# Patient Record
Sex: Male | Born: 1990 | Race: White | Hispanic: No | Marital: Married | Smoking: Former smoker
Health system: Southern US, Community
[De-identification: ages and names within clinical notes are randomized; demographics above are authoritative.]

---
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
## 2019-01-13 ENCOUNTER — Other Ambulatory Visit: Payer: Self-pay | Admitting: Adult Health

## 2019-01-13 DIAGNOSIS — F909 Attention-deficit hyperactivity disorder, unspecified type: Secondary | ICD-10-CM

## 2019-01-13 MED ORDER — AMPHETAMINE-DEXTROAMPHETAMINE 20 MG PO TABS: 20.0000 mg | ORAL_TABLET | Freq: Every day | ORAL | 0 refills | Status: DC

## 2019-02-10 ENCOUNTER — Ambulatory Visit (INDEPENDENT_AMBULATORY_CARE_PROVIDER_SITE_OTHER): Payer: Self-pay | Admitting: Adult Health

## 2019-02-10 ENCOUNTER — Encounter: Payer: Self-pay | Admitting: Adult Health

## 2019-02-10 ENCOUNTER — Other Ambulatory Visit: Payer: Self-pay

## 2019-02-10 DIAGNOSIS — F909 Attention-deficit hyperactivity disorder, unspecified type: Secondary | ICD-10-CM

## 2019-02-10 DIAGNOSIS — G4700 Insomnia, unspecified: Secondary | ICD-10-CM

## 2019-02-10 DIAGNOSIS — F411 Generalized anxiety disorder: Secondary | ICD-10-CM

## 2019-02-10 DIAGNOSIS — F331 Major depressive disorder, recurrent, moderate: Secondary | ICD-10-CM

## 2019-02-10 MED ORDER — BUSPIRONE HCL 10 MG PO TABS: 10.0000 mg | ORAL_TABLET | Freq: Three times a day (TID) | ORAL | 5 refills | Status: DC

## 2019-02-10 MED ORDER — AMPHETAMINE-DEXTROAMPHET ER 30 MG PO CP24: 30.0000 mg | ORAL_CAPSULE | Freq: Every day | ORAL | 0 refills | Status: DC

## 2019-02-10 MED ORDER — ESCITALOPRAM OXALATE 20 MG PO TABS: 20.0000 mg | ORAL_TABLET | Freq: Every day | ORAL | 5 refills | Status: DC

## 2019-02-10 NOTE — Progress Notes (Signed)
Devin Walker [id number] 07/29/1990 28 y.o.  Subjective:   Patient ID:  Devin Walker is a 28 y.o. (DOB 30-Sep-1990) male.  Chief Complaint:  Chief Complaint  Patient presents with  . ADHD  . Anxiety  . Depression    HPI Devin Walker presents to the office today for follow-up of anxiety, depression, and ADHD.  Describes mood today as ok. Pleasant. Mood symptoms - denies depression, anxiety, and irritability. Stating "I've been doing pretty good". Working full-time and having to study 3 hours a fay. Got a Oncologist with [employer] - 6 month program. If does well will have a job next April. Stable interest and motivation. Taking medications as prescribed.  Energy levels stable. Active, does not have a regular exercise routine. Works full-time as Radiographer, therapeutic. Increased workload with partner out of work for past few months.  Enjoys some usual interests and activities. Spending time with family - wife and 4 children. Mother local. Appetite adequate. Weight stable. Sleeps well most nights. Averages 8 hours. Focus and concentration stable. Concerned about Adderall IR formula not lasting long enough with increased study time - wanting to try the Xr formula. Completing tasks. Managing aspects of household. Work and school going well.  Denies SI or HI. Denies AH or VH.  Review of Systems:  Review of Systems  Musculoskeletal: Negative for gait problem.  Neurological: Negative for tremors.  Psychiatric/Behavioral:       Please refer to HPI    Medications: I have reviewed the patient's current medications.  Current Outpatient Medications  Medication Sig Dispense Refill  . amphetamine-dextroamphetamine (ADDERALL XR) 30 MG 24 hr capsule Take 1 capsule (30 mg total) by mouth daily. 30 capsule 0  . [START ON 03/10/2019] amphetamine-dextroamphetamine (ADDERALL XR) 30 MG 24 hr capsule Take 1 capsule (30 mg total) by mouth daily. 30 capsule 0  . [START ON 04/07/2019] amphetamine-dextroamphetamine  (ADDERALL XR) 30 MG 24 hr capsule Take 1 capsule (30 mg total) by mouth daily. 30 capsule 0  . busPIRone (BUSPAR) 10 MG tablet Take 1 tablet (10 mg total) by mouth 3 (three) times daily. 90 tablet 5  . escitalopram (LEXAPRO) 20 MG tablet Take 1 tablet (20 mg total) by mouth daily. 30 tablet 5   No current facility-administered medications for this visit.     Medication Side Effects: None  Allergies: No Known Allergies  History reviewed. No pertinent past medical history.  History reviewed. No pertinent family history.  Social History   Socioeconomic History  . Marital status: Single    Spouse name: Not on file  . Number of children: Not on file  . Years of education: Not on file  . Highest education level: Not on file  Occupational History  . Not on file  Social Needs  . Financial resource strain: Not on file  . Food insecurity    Worry: Not on file    Inability: Not on file  . Transportation needs    Medical: Not on file    Non-medical: Not on file  Tobacco Use  . Smoking status: Unknown If Ever Smoked  . Smokeless tobacco: Never Used  Substance and Sexual Activity  . Alcohol use: Not on file  . Drug use: Not on file  . Sexual activity: Not on file  Lifestyle  . Physical activity    Days per week: Not on file    Minutes per session: Not on file  . Stress: Not on file  Relationships  . Social connections  Talks on phone: Not on file    Gets together: Not on file    Attends religious service: Not on file    Active member of club or organization: Not on file    Attends meetings of clubs or organizations: Not on file    Relationship status: Not on file  . Intimate partner violence    Fear of current or ex partner: Not on file    Emotionally abused: Not on file    Physically abused: Not on file    Forced sexual activity: Not on file  Other Topics Concern  . Not on file  Social History Narrative  . Not on file    Past Medical History, Surgical history,  Social history, and Family history were reviewed and updated as appropriate.   Please see review of systems for further details on the patient's review from today.   Objective:   Physical Exam:  There were no vitals taken for this visit.  Physical Exam Constitutional:      General: He is not in acute distress.    Appearance: He is well-developed.  Musculoskeletal:        General: No deformity.  Neurological:     Mental Status: He is alert and oriented to person, place, and time.     Coordination: Coordination normal.  Psychiatric:        Attention and Perception: Attention and perception normal. He does not perceive auditory or visual hallucinations.        Mood and Affect: Mood normal. Mood is not anxious or depressed. Affect is not labile, blunt, angry or inappropriate.        Speech: Speech normal.        Behavior: Behavior normal.        Thought Content: Thought content normal. Thought content is not paranoid or delusional. Thought content does not include homicidal or suicidal ideation. Thought content does not include homicidal or suicidal plan.        Cognition and Memory: Cognition and memory normal.        Judgment: Judgment normal.     Comments: Insight intact     Lab Review:  No results found for: NA, K, CL, CO2, GLUCOSE, BUN, CREATININE, CALCIUM, PROT, ALBUMIN, AST, ALT, ALKPHOS, BILITOT, GFRNONAA, GFRAA  No results found for: WBC, RBC, HGB, HCT, PLT, MCV, MCH, MCHC, RDW, LYMPHSABS, MONOABS, EOSABS, BASOSABS  No results found for: POCLITH, LITHIUM   No results found for: PHENYTOIN, PHENOBARB, VALPROATE, CBMZ   .res Assessment: Plan:    Plan:  1. Lexapro 20mg  daily 2. Adderall XR 30mg  daily 3. Buspar 10mg  TID  RTC 3 months  Patient advised to contact office with any questions, adverse effects, or acute worsening in signs and symptoms.  Discussed potential benefits, risks, and side effects of stimulants with patient to include increased heart rate,  palpitations, insomnia, increased anxiety, increased irritability, or decreased appetite.  Instructed patient to contact office if experiencing any significant tolerability issues.  Devin Walker was seen today for adhd, anxiety and depression.  Diagnoses and all orders for this visit:  Generalized anxiety disorder -     escitalopram (LEXAPRO) 20 MG tablet; Take 1 tablet (20 mg total) by mouth daily. -     busPIRone (BUSPAR) 10 MG tablet; Take 1 tablet (10 mg total) by mouth 3 (three) times daily.  Major depressive disorder, recurrent episode, moderate (HCC) -     escitalopram (LEXAPRO) 20 MG tablet; Take 1 tablet (20 mg total) by mouth  daily.  Attention deficit hyperactivity disorder (ADHD), unspecified ADHD type -     amphetamine-dextroamphetamine (ADDERALL XR) 30 MG 24 hr capsule; Take 1 capsule (30 mg total) by mouth daily. -     amphetamine-dextroamphetamine (ADDERALL XR) 30 MG 24 hr capsule; Take 1 capsule (30 mg total) by mouth daily. -     amphetamine-dextroamphetamine (ADDERALL XR) 30 MG 24 hr capsule; Take 1 capsule (30 mg total) by mouth daily.  Insomnia, unspecified type     Please see After Visit Summary for patient specific instructions.  Future Appointments  Date Time Provider Department Center  05/13/2019  8:00 AM Ori Trejos, Thereasa Solo, NP [facility] None    No orders of the defined types were placed in this encounter.   -------------------------------

## 2019-03-16 ENCOUNTER — Other Ambulatory Visit: Payer: Self-pay

## 2019-03-16 DIAGNOSIS — Z20822 Contact with and (suspected) exposure to covid-19: Secondary | ICD-10-CM

## 2019-03-17 LAB — NOVEL CORONAVIRUS, NAA: SARS-CoV-2, NAA: NOT DETECTED

## 2019-05-13 ENCOUNTER — Encounter: Payer: Self-pay | Admitting: Adult Health

## 2019-05-13 ENCOUNTER — Other Ambulatory Visit: Payer: Self-pay

## 2019-05-13 ENCOUNTER — Ambulatory Visit (INDEPENDENT_AMBULATORY_CARE_PROVIDER_SITE_OTHER): Payer: Self-pay | Admitting: Adult Health

## 2019-05-13 DIAGNOSIS — F909 Attention-deficit hyperactivity disorder, unspecified type: Secondary | ICD-10-CM

## 2019-05-13 DIAGNOSIS — F331 Major depressive disorder, recurrent, moderate: Secondary | ICD-10-CM

## 2019-05-13 DIAGNOSIS — F411 Generalized anxiety disorder: Secondary | ICD-10-CM

## 2019-05-13 DIAGNOSIS — G4700 Insomnia, unspecified: Secondary | ICD-10-CM

## 2019-05-13 MED ORDER — AMPHETAMINE-DEXTROAMPHET ER 30 MG PO CP24: 30.0000 mg | ORAL_CAPSULE | Freq: Every day | ORAL | 0 refills | Status: DC

## 2019-05-13 NOTE — Progress Notes (Signed)
Devin Walker [id number] 1990-11-07 29 y.o.  Subjective:   Patient ID:  Devin Walker is a 29 y.o. (DOB [birth year]) male.  Chief Complaint:  Chief Complaint  Patient presents with  . Anxiety  . Depression  . Insomnia  . ADHD    HPI   Devin Walker presents to the office today for follow-up of anxiety, insomnia, depression, and ADHD.  Describes mood today as "ok". Pleasant. Mood symptoms - denies depression, anxiety, and irritability. Stating "I'm doing good". Family doing well. Enjoyed the holidays. Stable interest and motivation. Taking medications as prescribed.  Energy levels stable. Active, does not have a regular exercise routine. 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   Enjoys some usual interests and activities. Married. Lives with wife and 4 children. Mother local and supportive.  Appetite adequate. Weight stable. Sleeps well most nights. Averages 7 to 8 hours. Focus and concentration stable. Feels like current dose of Adderall is working well. Completing tasks. Managing aspects of household. Work and school going well.  Denies SI or HI. Denies AH or VH.   Review of Systems:  Review of Systems  Musculoskeletal: Negative for gait problem.  Neurological: Negative for tremors.  Psychiatric/Behavioral:       Please refer to HPI    Medications: I have reviewed the patient's current medications.  Current Outpatient Medications  Medication Sig Dispense Refill  . amphetamine-dextroamphetamine (ADDERALL XR) 30 MG 24 hr capsule Take 1 capsule (30 mg total) by mouth daily. 30 capsule 0  . [START ON 06/10/2019] amphetamine-dextroamphetamine (ADDERALL XR) 30 MG 24 hr capsule Take 1 capsule (30 mg total) by mouth daily. 30 capsule 0  . [START ON 07/08/2019] amphetamine-dextroamphetamine (ADDERALL XR) 30 MG 24 hr capsule Take 1 capsule (30 mg total) by mouth daily. 30 capsule 0  . busPIRone (BUSPAR) 10 MG  tablet Take 1 tablet (10 mg total) by mouth 3 (three) times daily. 90 tablet 5  . escitalopram (LEXAPRO) 20 MG tablet Take 1 tablet (20 mg total) by mouth daily. 30 tablet 5   No current facility-administered medications for this visit.    Medication Side Effects: None  Allergies: No Known Allergies  History reviewed. No pertinent past medical history.  History reviewed. No pertinent family history.  Social History   Socioeconomic History  . Marital status: Single    Spouse name: Not on file  . Number of children: Not on file  . Years of education: Not on file  . Highest education level: Not on file  Occupational History  . Not on file  Tobacco Use  . Smoking status: Unknown If Ever Smoked  . Smokeless tobacco: Never Used  Substance and Sexual Activity  . Alcohol use: Not on file  . Drug use: Not on file  . Sexual activity: Not on file  Other Topics Concern  . Not on file  Social History Narrative  . Not on file   Social Determinants of Health   Financial Resource Strain:   . Difficulty of Paying Living Expenses: Not on file  Food Insecurity:   . Worried About Programme researcher, broadcasting/film/video in the Last Year: Not on file  . Ran Out of Food in the Last Year: Not on file  Transportation Needs:   . Lack of Transportation (Medical): Not on file  . Lack of Transportation (Non-Medical): Not on file  Physical Activity:   . Days of Exercise per Week: Not  on file  . Minutes of Exercise per Session: Not on file  Stress:   . Feeling of Stress : Not on file  Social Connections:   . Frequency of Communication with Friends and Family: Not on file  . Frequency of Social Gatherings with Friends and Family: Not on file  . Attends Religious Services: Not on file  . Active Member of Clubs or Organizations: Not on file  . Attends Banker Meetings: Not on file  . Marital Status: Not on file  Intimate Partner Violence:   . Fear of Current or Ex-Partner: Not on file  .  Emotionally Abused: Not on file  . Physically Abused: Not on file  . Sexually Abused: Not on file    Past Medical History, Surgical history, Social history, and Family history were reviewed and updated as appropriate.   Please see review of systems for further details on the patient's review from today.   Objective:   Physical Exam:  There were no vitals taken for this visit.  Physical Exam Constitutional:      General: He is not in acute distress.    Appearance: He is well-developed.  Musculoskeletal:        General: No deformity.  Neurological:     Mental Status: He is alert and oriented to person, place, and time.     Coordination: Coordination normal.  Psychiatric:        Attention and Perception: Attention and perception normal. He does not perceive auditory or visual hallucinations.        Mood and Affect: Mood normal. Mood is not anxious or depressed. Affect is not labile, blunt, angry or inappropriate.        Speech: Speech normal.        Behavior: Behavior normal.        Thought Content: Thought content normal. Thought content is not paranoid or delusional. Thought content does not include homicidal or suicidal ideation. Thought content does not include homicidal or suicidal plan.        Cognition and Memory: Cognition and memory normal.        Judgment: Judgment normal.     Comments: Insight intact     Lab Review:  No results found for: NA, K, CL, CO2, GLUCOSE, BUN, CREATININE, CALCIUM, PROT, ALBUMIN, AST, ALT, ALKPHOS, BILITOT, GFRNONAA, GFRAA  No results found for: WBC, RBC, HGB, HCT, PLT, MCV, MCH, MCHC, RDW, LYMPHSABS, MONOABS, EOSABS, BASOSABS  No results found for: POCLITH, LITHIUM   No results found for: PHENYTOIN, PHENOBARB, VALPROATE, CBMZ   .res Assessment: Plan:    Plan:  1. Lexapro 20mg  daily 2. Adderall XR 30mg  daily 3. Buspar 10mg  TID  RTC 3 months  Patient advised to contact office with any questions, adverse effects, or acute worsening  in signs and symptoms.  Discussed potential benefits, risks, and side effects of stimulants with patient to include increased heart rate, palpitations, insomnia, increased anxiety, increased irritability, or decreased appetite.  Instructed patient to contact office if experiencing any significant tolerability issues.   Devin Walker was seen today for anxiety, depression, insomnia and adhd.  Diagnoses and all orders for this visit:  Insomnia, unspecified type  Attention deficit hyperactivity disorder (ADHD), unspecified ADHD type -     amphetamine-dextroamphetamine (ADDERALL XR) 30 MG 24 hr capsule; Take 1 capsule (30 mg total) by mouth daily. -     amphetamine-dextroamphetamine (ADDERALL XR) 30 MG 24 hr capsule; Take 1 capsule (30 mg total) by mouth daily. -  amphetamine-dextroamphetamine (ADDERALL XR) 30 MG 24 hr capsule; Take 1 capsule (30 mg total) by mouth daily.  Major depressive disorder, recurrent episode, moderate (HCC)  Generalized anxiety disorder     Please see After Visit Summary for patient specific instructions.  No future appointments.  No orders of the defined types were placed in this encounter.   -------------------------------

## 2019-05-19 ENCOUNTER — Telehealth: Payer: Self-pay | Admitting: Adult Health

## 2019-05-19 NOTE — Telephone Encounter (Signed)
Pt called to ask to talk to you on phone. Needs a letter for work. Will explain when he speaks with [phone] Advise if he has to have appt for this. Will set up.

## 2019-05-20 NOTE — Telephone Encounter (Signed)
Left voicemail to call back with more information 

## 2019-05-20 NOTE — Telephone Encounter (Signed)
Can we find out what he needs specifically?

## 2019-05-25 ENCOUNTER — Telehealth: Payer: Self-pay | Admitting: Adult Health

## 2019-05-25 NOTE — Telephone Encounter (Signed)
Patient will need to take a brief break from work. He has FMLA ppwk available to complete. Does he need to come in or can we fill it out? [phone].

## 2019-05-25 NOTE — Telephone Encounter (Signed)
We can do a phone visit 

## 2019-05-26 NOTE — Telephone Encounter (Signed)
This has been scheduled

## 2019-05-27 ENCOUNTER — Encounter: Payer: Self-pay | Admitting: Adult Health

## 2019-05-27 ENCOUNTER — Ambulatory Visit (INDEPENDENT_AMBULATORY_CARE_PROVIDER_SITE_OTHER): Payer: Self-pay | Admitting: Adult Health

## 2019-05-27 DIAGNOSIS — F331 Major depressive disorder, recurrent, moderate: Secondary | ICD-10-CM

## 2019-05-27 DIAGNOSIS — G4700 Insomnia, unspecified: Secondary | ICD-10-CM

## 2019-05-27 DIAGNOSIS — F909 Attention-deficit hyperactivity disorder, unspecified type: Secondary | ICD-10-CM

## 2019-05-27 DIAGNOSIS — F411 Generalized anxiety disorder: Secondary | ICD-10-CM

## 2019-05-27 NOTE — Progress Notes (Signed)
[name] [id number] 1991/04/08 29 y.o.  Virtual Visit via Telephone Note  I connected with pt on 05/27/19 at  8:40 AM EST by telephone and verified that I am speaking with the correct person using two identifiers.   I discussed the limitations, risks, security and privacy concerns of performing an evaluation and management service by telephone and the availability of in person appointments. I also discussed with the patient that there may be a patient responsible charge related to this service. The patient expressed understanding and agreed to proceed.   I discussed the assessment and treatment plan with the patient. The patient was provided an opportunity to ask questions and all were answered. The patient agreed with the plan and demonstrated an understanding of the instructions.   The patient was advised to call back or seek an in-person evaluation if the symptoms worsen or if the condition fails to improve as anticipated.  I provided 30 minutes of non-face-to-face time during this encounter.  The patient was located at home.  The provider was located at [facility].   Dorothyann Gibbs, NP   Subjective:   Patient ID:  Devin Walker is a 29 y.o. (DOB 1990-11-12) male.  Chief Complaint:  Chief Complaint  Patient presents with  . Anxiety  . Depression  . ADHD  . Insomnia    HPI [name] presents for follow-up of GAD, MDD, insomnia, and ADHD.  Describes mood today as "so-so". Pleasant. Mood symptoms - reports depression, anxiety, and irritability. Stating "I've been having a difficult time". Having to take some "personal time" away from work. Was at work and started feeling overwhelmed. Has been "managing" at work without any assistance for several months. Stating "I'm just burnt out". His partner has been out of work for months also. Stating "I just hit a wall". Felt physically "unwell". Notified his employer he needed some time off. Family doing well. Decreased  interest and motivation. Taking medications as prescribed.  Energy levels lower. Active, does not have a regular exercise routine. 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 Enjoys some usual interests and activities. Married. Lives with wife and 4 children. Mother local and supportive.  Appetite adequate. Weight stable. Sleeps well most nights. Averages 6 to 7 hours. Focus and concentration stable. Feels like current dose of Adderall is working well. Completing tasks. Managing aspects of household. Difficulties in work setting. Doing well in school.  Denies SI or HI. Denies AH or VH.  Review of Systems:  Review of Systems  Musculoskeletal: Negative for gait problem.  Neurological: Negative for tremors.  Psychiatric/Behavioral:       Please refer to HPI    Medications: I have reviewed the patient's current medications.  Current Outpatient Medications  Medication Sig Dispense Refill  . amphetamine-dextroamphetamine (ADDERALL XR) 30 MG 24 hr capsule Take 1 capsule (30 mg total) by mouth daily. 30 capsule 0  . [START ON 06/10/2019] amphetamine-dextroamphetamine (ADDERALL XR) 30 MG 24 hr capsule Take 1 capsule (30 mg total) by mouth daily. 30 capsule 0  . [START ON 07/08/2019] amphetamine-dextroamphetamine (ADDERALL XR) 30 MG 24 hr capsule Take 1 capsule (30 mg total) by mouth daily. 30 capsule 0  . busPIRone (BUSPAR) 10 MG tablet Take 1 tablet (10 mg total) by mouth 3 (three) times daily. 90 tablet 5  . escitalopram (LEXAPRO) 20 MG tablet Take 1 tablet (20 mg total) by mouth daily. 30 tablet 5   No current  facility-administered medications for this visit.    Medication Side Effects: None  Allergies: No Known Allergies  No past medical history on file.  No family history on file.  Social History   Socioeconomic History  . Marital status: Single    Spouse name: Not on file  . Number of children: Not on file  . Years of  education: Not on file  . Highest education level: Not on file  Occupational History  . Not on file  Tobacco Use  . Smoking status: Unknown If Ever Smoked  . Smokeless tobacco: Never Used  Substance and Sexual Activity  . Alcohol use: Not on file  . Drug use: Not on file  . Sexual activity: Not on file  Other Topics Concern  . Not on file  Social History Narrative  . Not on file   Social Determinants of Health   Financial Resource Strain:   . Difficulty of Paying Living Expenses: Not on file  Food Insecurity:   . Worried About Programme researcher, broadcasting/film/video in the Last Year: Not on file  . Ran Out of Food in the Last Year: Not on file  Transportation Needs:   . Lack of Transportation (Medical): Not on file  . Lack of Transportation (Non-Medical): Not on file  Physical Activity:   . Days of Exercise per Week: Not on file  . Minutes of Exercise per Session: Not on file  Stress:   . Feeling of Stress : Not on file  Social Connections:   . Frequency of Communication with Friends and Family: Not on file  . Frequency of Social Gatherings with Friends and Family: Not on file  . Attends Religious Services: Not on file  . Active Member of Clubs or Organizations: Not on file  . Attends Banker Meetings: Not on file  . Marital Status: Not on file  Intimate Partner Violence:   . Fear of Current or Ex-Partner: Not on file  . Emotionally Abused: Not on file  . Physically Abused: Not on file  . Sexually Abused: Not on file    Past Medical History, Surgical history, Social history, and Family history were reviewed and updated as appropriate.   Please see review of systems for further details on the patient's review from today.   Objective:   Physical Exam:  There were no vitals taken for this visit.  Physical Exam Constitutional:      General: He is not in acute distress.    Appearance: He is well-developed.  Musculoskeletal:        General: No deformity.  Neurological:      Mental Status: He is alert and oriented to person, place, and time.     Coordination: Coordination normal.  Psychiatric:        Attention and Perception: Attention and perception normal. He does not perceive auditory or visual hallucinations.        Mood and Affect: Mood is anxious and depressed. Affect is not labile, blunt, angry or inappropriate.        Speech: Speech normal.        Behavior: Behavior normal.        Thought Content: Thought content normal. Thought content is not paranoid or delusional. Thought content does not include homicidal or suicidal ideation. Thought content does not include homicidal or suicidal plan.        Cognition and Memory: Cognition and memory normal.        Judgment: Judgment normal.  Comments: Insight intact     Lab Review:  No results found for: NA, K, CL, CO2, GLUCOSE, BUN, CREATININE, CALCIUM, PROT, ALBUMIN, AST, ALT, ALKPHOS, BILITOT, GFRNONAA, GFRAA  No results found for: WBC, RBC, HGB, HCT, PLT, MCV, MCH, MCHC, RDW, LYMPHSABS, MONOABS, EOSABS, BASOSABS  No results found for: POCLITH, LITHIUM   No results found for: PHENYTOIN, PHENOBARB, VALPROATE, CBMZ   .res Assessment: Plan:    Plan:  1. Lexapro 20mg  daily 2. Adderall XR 30mg  daily 3. Buspar 10mg  TID  Out of work 05/19/2019 through 06/07/2019- May return to work on 06/08/2019. Will fill out FMLA paperwork - intermittent FMLA.   RTC 4 weeks  Patient advised to contact office with any questions, adverse effects, or acute worsening in signs and symptoms.  Discussed potential benefits, risks, and side effects of stimulants with patient to include increased heart rate, palpitations, insomnia, increased anxiety, increased irritability, or decreased appetite.  Instructed patient to contact office if experiencing any significant tolerability issues.  Devin Walker was seen today for anxiety, depression, adhd and insomnia.  Diagnoses and all orders for this visit:  Generalized anxiety  disorder  Major depressive disorder, recurrent episode, moderate (HCC)  Attention deficit hyperactivity disorder (ADHD), unspecified ADHD type  Insomnia, unspecified type    Please see After Visit Summary for patient specific instructions.  Future Appointments  Date Time Provider [place]  08/11/2019  8:20 AM Roxsana Riding, Berdie Ogren, NP [facility] None    No orders of the defined types were placed in this encounter.     -------------------------------

## 2019-06-02 DIAGNOSIS — Z0289 Encounter for other administrative examinations: Secondary | ICD-10-CM

## 2019-08-11 ENCOUNTER — Ambulatory Visit: Payer: Self-pay | Admitting: Adult Health

## 2019-08-13 ENCOUNTER — Telehealth: Payer: Self-pay | Admitting: Adult Health

## 2019-08-13 ENCOUNTER — Other Ambulatory Visit: Payer: Self-pay

## 2019-08-13 DIAGNOSIS — F909 Attention-deficit hyperactivity disorder, unspecified type: Secondary | ICD-10-CM

## 2019-08-13 NOTE — Telephone Encounter (Signed)
Devin Walker called.  Missed his appt this week.  Rs for 4/29.  Needs refill of his Adderall, today if possible.  Send to [facility] on [address].

## 2019-08-13 NOTE — Telephone Encounter (Signed)
Last refill 07/14/2019, pended for Dr. Jennelle Human to review and submit if appropriate

## 2019-08-16 MED ORDER — AMPHETAMINE-DEXTROAMPHET ER 30 MG PO CP24: 30.0000 mg | ORAL_CAPSULE | Freq: Every day | ORAL | 0 refills | Status: DC

## 2019-08-22 ENCOUNTER — Other Ambulatory Visit: Payer: Self-pay | Admitting: Adult Health

## 2019-08-22 DIAGNOSIS — F331 Major depressive disorder, recurrent, moderate: Secondary | ICD-10-CM

## 2019-08-22 DIAGNOSIS — F411 Generalized anxiety disorder: Secondary | ICD-10-CM

## 2019-08-23 ENCOUNTER — Telehealth: Payer: Self-pay | Admitting: Adult Health

## 2019-08-23 NOTE — Telephone Encounter (Signed)
Refills already sent this morning

## 2019-08-23 NOTE — Telephone Encounter (Signed)
Pt would like a refill on Lexapro and Buspar. Pt has appt on Thurs, but is completely out of Lexapro. Please send to [facility] on [address].

## 2019-08-26 ENCOUNTER — Encounter: Payer: Self-pay | Admitting: Adult Health

## 2019-08-26 ENCOUNTER — Ambulatory Visit (INDEPENDENT_AMBULATORY_CARE_PROVIDER_SITE_OTHER): Payer: Self-pay | Admitting: Adult Health

## 2019-08-26 ENCOUNTER — Ambulatory Visit: Payer: Self-pay | Admitting: Adult Health

## 2019-08-26 ENCOUNTER — Other Ambulatory Visit: Payer: Self-pay

## 2019-08-26 DIAGNOSIS — F331 Major depressive disorder, recurrent, moderate: Secondary | ICD-10-CM

## 2019-08-26 DIAGNOSIS — F909 Attention-deficit hyperactivity disorder, unspecified type: Secondary | ICD-10-CM

## 2019-08-26 DIAGNOSIS — F411 Generalized anxiety disorder: Secondary | ICD-10-CM

## 2019-08-26 MED ORDER — AMPHETAMINE-DEXTROAMPHET ER 30 MG PO CP24: 30.0000 mg | ORAL_CAPSULE | Freq: Every day | ORAL | 0 refills | Status: DC

## 2019-08-26 NOTE — Progress Notes (Signed)
Devin Walker [id number] 1990/07/28 29 y.o.  Subjective:   Patient ID:  Devin Walker is a 29 y.o. (DOB 1991/01/23) male.  Chief Complaint: No chief complaint on file.   HPI Devin Walker presents to the office today for follow-up of GAD, MDD, insomnia, and ADHD.  Describes mood today as "ok". Pleasant. Mood symptoms - denies depression, anxiety, and irritability. Stating "I' think the medication is keeping me balanced". Also stating "I feel level". Starting a new position at [employer]. Will be doing on the job training for the next 12 months. Working from home next 6 to 12 months. Family doing well. Youngest son had recent throat surgery - "recovering well". Decreased interest and motivation. Taking medications as prescribed.  Energy levels improved. Active, has a regular exercise routine.  Enjoys some usual interests and activities. Married. Lives with wife of 9 years and 4 children. Mother local and supportive.  Appetite adequate. Weight stable. Sleeps well most nights. Averages 6 to 7 hours. Focus and concentration stable with Adderall. Completing tasks. Managing aspects of household.  Denies SI or HI. Denies AH or VH.  Review of Systems:  Review of Systems  Musculoskeletal: Negative for gait problem.  Neurological: Negative for tremors.  Psychiatric/Behavioral:       Please refer to HPI    Medications: I have reviewed the patient's current medications.  Current Outpatient Medications  Medication Sig Dispense Refill  . amphetamine-dextroamphetamine (ADDERALL XR) 30 MG 24 hr capsule Take 1 capsule (30 mg total) by mouth daily. 30 capsule 0  . [START ON 09/23/2019] amphetamine-dextroamphetamine (ADDERALL XR) 30 MG 24 hr capsule Take 1 capsule (30 mg total) by mouth daily. 30 capsule 0  . [START ON 10/21/2019] amphetamine-dextroamphetamine (ADDERALL XR) 30 MG 24 hr capsule Take 1 capsule (30 mg total) by mouth daily. 30 capsule 0  . busPIRone (BUSPAR) 10 MG tablet TAKE ONE TABLET BY MOUTH  THREE TIMES A DAY 90 tablet 4  . escitalopram (LEXAPRO) 20 MG tablet TAKE ONE TABLET BY MOUTH DAILY 30 tablet 4   No current facility-administered medications for this visit.    Medication Side Effects: None  Allergies: No Known Allergies  No past medical history on file.  No family history on file.  Social History   Socioeconomic History  . Marital status: Married    Spouse name: Not on file  . Number of children: Not on file  . Years of education: Not on file  . Highest education level: Not on file  Occupational History  . Not on file  Tobacco Use  . Smoking status: Unknown If Ever Smoked  . Smokeless tobacco: Never Used  Substance and Sexual Activity  . Alcohol use: Not on file  . Drug use: Not on file  . Sexual activity: Not on file  Other Topics Concern  . Not on file  Social History Narrative  . Not on file   Social Determinants of Health   Financial Resource Strain:   . Difficulty of Paying Living Expenses:   Food Insecurity:   . Worried About Charity fundraiser in the Last Year:   . Arboriculturist in the Last Year:   Transportation Needs:   . Film/video editor (Medical):   Marland Kitchen Lack of Transportation (Non-Medical):   Physical Activity:   . Days of Exercise per Week:   . Minutes of Exercise per Session:   Stress:   . Feeling of Stress :   Social Connections:   . Frequency of Communication  with Friends and Family:   . Frequency of Social Gatherings with Friends and Family:   . Attends Religious Services:   . Active Member of Clubs or Organizations:   . Attends Banker Meetings:   Marland Kitchen Marital Status:   Intimate Partner Violence:   . Fear of Current or Ex-Partner:   . Emotionally Abused:   Marland Kitchen Physically Abused:   . Sexually Abused:     Past Medical History, Surgical history, Social history, and Family history were reviewed and updated as appropriate.   Please see review of systems for further details on the patient's review from  today.   Objective:   Physical Exam:  There were no vitals taken for this visit.  Physical Exam Constitutional:      General: He is not in acute distress. Musculoskeletal:        General: No deformity.  Neurological:     Mental Status: He is alert and oriented to person, place, and time.     Coordination: Coordination normal.  Psychiatric:        Attention and Perception: Attention and perception normal. He does not perceive auditory or visual hallucinations.        Mood and Affect: Mood normal. Mood is not anxious or depressed. Affect is not labile, blunt, angry or inappropriate.        Speech: Speech normal.        Behavior: Behavior normal.        Thought Content: Thought content normal. Thought content is not paranoid or delusional. Thought content does not include homicidal or suicidal ideation. Thought content does not include homicidal or suicidal plan.        Cognition and Memory: Cognition and memory normal.        Judgment: Judgment normal.     Comments: Insight intact     Lab Review:  No results found for: NA, K, CL, CO2, GLUCOSE, BUN, CREATININE, CALCIUM, PROT, ALBUMIN, AST, ALT, ALKPHOS, BILITOT, GFRNONAA, GFRAA  No results found for: WBC, RBC, HGB, HCT, PLT, MCV, MCH, MCHC, RDW, LYMPHSABS, MONOABS, EOSABS, BASOSABS  No results found for: POCLITH, LITHIUM   No results found for: PHENYTOIN, PHENOBARB, VALPROATE, CBMZ   .res Assessment: Plan:    Plan:  1. Lexapro 20mg  daily 2. Adderall XR 30mg  daily 3. Buspar 10mg  TID  RTC 3 months  Patient advised to contact office with any questions, adverse effects, or acute worsening in signs and symptoms.  Discussed potential benefits, risks, and side effects of stimulants with patient to include increased heart rate, palpitations, insomnia, increased anxiety, increased irritability, or decreased appetite.  Instructed patient to contact office if experiencing any significant tolerability issues.   Diagnoses and all  orders for this visit:  Attention deficit hyperactivity disorder (ADHD), unspecified ADHD type -     amphetamine-dextroamphetamine (ADDERALL XR) 30 MG 24 hr capsule; Take 1 capsule (30 mg total) by mouth daily. -     amphetamine-dextroamphetamine (ADDERALL XR) 30 MG 24 hr capsule; Take 1 capsule (30 mg total) by mouth daily. -     amphetamine-dextroamphetamine (ADDERALL XR) 30 MG 24 hr capsule; Take 1 capsule (30 mg total) by mouth daily.  Generalized anxiety disorder  Major depressive disorder, recurrent episode, moderate (HCC)     Please see After Visit Summary for patient specific instructions.  No future appointments.  No orders of the defined types were placed in this encounter.   -------------------------------

## 2019-11-23 ENCOUNTER — Other Ambulatory Visit: Payer: Self-pay

## 2019-11-23 ENCOUNTER — Telehealth: Payer: Self-pay | Admitting: Adult Health

## 2019-11-23 DIAGNOSIS — F909 Attention-deficit hyperactivity disorder, unspecified type: Secondary | ICD-10-CM

## 2019-11-23 NOTE — Telephone Encounter (Signed)
Devin Walker called to request refill of his Adderall.  Last dose is today.  Appt 7/29.  Sent to [facility] on [address]

## 2019-11-23 NOTE — Telephone Encounter (Signed)
Pended Rx for [name] to submit

## 2019-11-24 MED ORDER — AMPHETAMINE-DEXTROAMPHET ER 30 MG PO CP24: 30.0000 mg | ORAL_CAPSULE | Freq: Every day | ORAL | 0 refills | Status: DC

## 2019-11-25 ENCOUNTER — Telehealth (INDEPENDENT_AMBULATORY_CARE_PROVIDER_SITE_OTHER): Payer: 59 | Admitting: Adult Health

## 2019-11-25 ENCOUNTER — Telehealth: Payer: Self-pay | Admitting: Adult Health

## 2019-11-25 ENCOUNTER — Encounter: Payer: Self-pay | Admitting: Adult Health

## 2019-11-25 DIAGNOSIS — F331 Major depressive disorder, recurrent, moderate: Secondary | ICD-10-CM

## 2019-11-25 DIAGNOSIS — F909 Attention-deficit hyperactivity disorder, unspecified type: Secondary | ICD-10-CM

## 2019-11-25 DIAGNOSIS — F411 Generalized anxiety disorder: Secondary | ICD-10-CM | POA: Diagnosis not present

## 2019-11-25 DIAGNOSIS — F329 Major depressive disorder, single episode, unspecified: Secondary | ICD-10-CM | POA: Insufficient documentation

## 2019-11-25 DIAGNOSIS — F988 Other specified behavioral and emotional disorders with onset usually occurring in childhood and adolescence: Secondary | ICD-10-CM | POA: Insufficient documentation

## 2019-11-25 MED ORDER — ESCITALOPRAM OXALATE 20 MG PO TABS: 20.0000 mg | ORAL_TABLET | Freq: Every day | ORAL | 5 refills | Status: DC

## 2019-11-25 MED ORDER — BUSPIRONE HCL 10 MG PO TABS: 10.0000 mg | ORAL_TABLET | Freq: Three times a day (TID) | ORAL | 5 refills | Status: DC

## 2019-11-25 MED ORDER — AMPHETAMINE-DEXTROAMPHET ER 30 MG PO CP24: 30.0000 mg | ORAL_CAPSULE | Freq: Every day | ORAL | 0 refills | Status: DC

## 2019-11-25 NOTE — Telephone Encounter (Signed)
Devin Walker, fason are scheduled for a virtual visit with your provider today.    Just as we do with appointments in the office, we must obtain your consent to participate.  Your consent will be active for this visit and any virtual visit you may have with one of our providers in the next 365 days.    If you have a MyChart account, I can also send a copy of this consent to you electronically.  All virtual visits are billed to your insurance company just like a traditional visit in the office.  As this is a virtual visit, video technology does not allow for your provider to perform a traditional examination.  This may limit your provider's ability to fully assess your condition.  If your provider identifies any concerns that need to be evaluated in person or the need to arrange testing such as labs, EKG, etc, we will make arrangements to do so.    Although advances in technology are sophisticated, we cannot ensure that it will always work on either your end or our end.  If the connection with a video visit is poor, we may have to switch to a telephone visit.  With either a video or telephone visit, we are not always able to ensure that we have a secure connection.   I need to obtain your verbal consent now.   Are you willing to proceed with your visit today?   Devin Walker has provided verbal consent on 11/25/2019 for a virtual visit (video or telephone).   Dorothyann Gibbs, NP 11/25/2019  8:04 AM

## 2019-11-25 NOTE — Progress Notes (Signed)
Devin Walker [id number] [birth year] 29 y.o.  Virtual Visit via Video Note  I connected with pt @ on 11/25/19 at  8:00 AM EDT by a video enabled telemedicine application and verified that I am speaking with the correct person using two identifiers.   I discussed the limitations of evaluation and management by telemedicine and the availability of in person appointments. The patient expressed understanding and agreed to proceed.  I discussed the assessment and treatment plan with the patient. The patient was provided an opportunity to ask questions and all were answered. The patient agreed with the plan and demonstrated an understanding of the instructions.   The patient was advised to call back or seek an in-person evaluation if the symptoms worsen or if the condition fails to improve as anticipated.  I provided 20 minutes of non-face-to-face time during this encounter.  The patient was located at home.  The provider was located at [facility].   Dorothyann Gibbs, NP   Subjective:   Patient ID:  Devin Walker is a 29 y.o. (DOB Sep 21, 1990) male.  Chief Complaint: No chief complaint on file.   HPI Devin Walker presents for follow-up of GAD, MDD, insomnia, and ADHD.  Describes mood today as "ok". Pleasant. Mood symptoms - denies depression, anxiety, and irritability. Mood is "level". Stating "Ithings are really good". Also stating "I feel the best I have in a long time". Finishing up training with new job. Family doing well. Back injury - sciatica pain  For 4 weeks. Stable interest and motivation. Taking medications as prescribed.  Energy levels stable. Active, has a regular exercise routine.  Enjoys some usual interests and activities. Married. Lives with wife of 9 years and 4 children. Mother local and supportive.  Appetite adequate. Weight stable. Sleeps well most nights. Averages 6 to 7 hours. Focus and concentration stable with Adderall. Completing tasks. Managing aspects of  household. Work going well.  Denies SI or HI. Denies AH or VH.  Review of Systems:  Review of Systems  Musculoskeletal: Negative for gait problem.  Neurological: Negative for tremors.  Psychiatric/Behavioral:       Please refer to HPI    Medications: I have reviewed the patient's current medications.  Current Outpatient Medications  Medication Sig Dispense Refill  . amphetamine-dextroamphetamine (ADDERALL XR) 30 MG 24 hr capsule Take 1 capsule (30 mg total) by mouth daily. 30 capsule 0  . [START ON 12/23/2019] amphetamine-dextroamphetamine (ADDERALL XR) 30 MG 24 hr capsule Take 1 capsule (30 mg total) by mouth daily. 30 capsule 0  . [START ON 01/20/2020] amphetamine-dextroamphetamine (ADDERALL XR) 30 MG 24 hr capsule Take 1 capsule (30 mg total) by mouth daily. 30 capsule 0  . busPIRone (BUSPAR) 10 MG tablet Take 1 tablet (10 mg total) by mouth 3 (three) times daily. 90 tablet 5  . escitalopram (LEXAPRO) 20 MG tablet Take 1 tablet (20 mg total) by mouth daily. 30 tablet 5   No current facility-administered medications for this visit.    Medication Side Effects: None  Allergies: No Known Allergies  No past medical history on file.  No family history on file.  Social History   Socioeconomic History  . Marital status: Married    Spouse name: Not on file  . Number of children: Not on file  . Years of education: Not on file  . Highest education level: Not on file  Occupational History  . Not on file  Tobacco Use  . Smoking status: Unknown If Ever Smoked  .  Smokeless tobacco: Never Used  Substance and Sexual Activity  . Alcohol use: Not on file  . Drug use: Not on file  . Sexual activity: Not on file  Other Topics Concern  . Not on file  Social History Narrative  . Not on file   Social Determinants of Health   Financial Resource Strain:   . Difficulty of Paying Living Expenses:   Food Insecurity:   . Worried About Programme researcher, broadcasting/film/video in the Last Year:   . Garment/textile technologist in the Last Year:   Transportation Needs:   . Freight forwarder (Medical):   Marland Kitchen Lack of Transportation (Non-Medical):   Physical Activity:   . Days of Exercise per Week:   . Minutes of Exercise per Session:   Stress:   . Feeling of Stress :   Social Connections:   . Frequency of Communication with Friends and Family:   . Frequency of Social Gatherings with Friends and Family:   . Attends Religious Services:   . Active Member of Clubs or Organizations:   . Attends Banker Meetings:   Marland Kitchen Marital Status:   Intimate Partner Violence:   . Fear of Current or Ex-Partner:   . Emotionally Abused:   Marland Kitchen Physically Abused:   . Sexually Abused:     Past Medical History, Surgical history, Social history, and Family history were reviewed and updated as appropriate.   Please see review of systems for further details on the patient's review from today.   Objective:   Physical Exam:  There were no vitals taken for this visit.  Physical Exam Neurological:     Mental Status: He is alert and oriented to person, place, and time.     Cranial Nerves: No dysarthria.  Psychiatric:        Attention and Perception: Attention and perception normal.        Mood and Affect: Mood normal.        Speech: Speech normal.        Behavior: Behavior is cooperative.        Thought Content: Thought content normal. Thought content is not paranoid or delusional. Thought content does not include homicidal or suicidal ideation. Thought content does not include homicidal or suicidal plan.        Cognition and Memory: Cognition and memory normal.        Judgment: Judgment normal.     Comments: Insight intact     Lab Review:  No results found for: NA, K, CL, CO2, GLUCOSE, BUN, CREATININE, CALCIUM, PROT, ALBUMIN, AST, ALT, ALKPHOS, BILITOT, GFRNONAA, GFRAA  No results found for: WBC, RBC, HGB, HCT, PLT, MCV, MCH, MCHC, RDW, LYMPHSABS, MONOABS, EOSABS, BASOSABS  No results found for:  POCLITH, LITHIUM   No results found for: PHENYTOIN, PHENOBARB, VALPROATE, CBMZ   .res Assessment: Plan:    Plan:  1. Lexapro 20mg  daily 2. Adderall XR 30mg  daily 3. Buspar 10mg  TID  RTC 3 months  Patient advised to contact office with any questions, adverse effects, or acute worsening in signs and symptoms.  Discussed potential benefits, risks, and side effects of stimulants with patient to include increased heart rate, palpitations, insomnia, increased anxiety, increased irritability, or decreased appetite.  Instructed patient to contact office if experiencing any significant tolerability issues.    Diagnoses and all orders for this visit:  Attention deficit hyperactivity disorder (ADHD), unspecified ADHD type -     amphetamine-dextroamphetamine (ADDERALL XR) 30 MG 24 hr capsule;  Take 1 capsule (30 mg total) by mouth daily. -     amphetamine-dextroamphetamine (ADDERALL XR) 30 MG 24 hr capsule; Take 1 capsule (30 mg total) by mouth daily. -     amphetamine-dextroamphetamine (ADDERALL XR) 30 MG 24 hr capsule; Take 1 capsule (30 mg total) by mouth daily.  Generalized anxiety disorder -     escitalopram (LEXAPRO) 20 MG tablet; Take 1 tablet (20 mg total) by mouth daily. -     busPIRone (BUSPAR) 10 MG tablet; Take 1 tablet (10 mg total) by mouth 3 (three) times daily.  Major depressive disorder, recurrent episode, moderate (HCC) -     escitalopram (LEXAPRO) 20 MG tablet; Take 1 tablet (20 mg total) by mouth daily.     Please see After Visit Summary for patient specific instructions.  No future appointments.  No orders of the defined types were placed in this encounter.     -------------------------------

## 2019-12-31 ENCOUNTER — Telehealth: Payer: Self-pay

## 2019-12-31 NOTE — Telephone Encounter (Signed)
Prior authorization submitted and approved for ADDERALL XR 30 MG #30 effective 12/27/2019-12/26/2022 with [employer] ID# [id number]

## 2020-04-06 ENCOUNTER — Other Ambulatory Visit: Payer: Self-pay | Admitting: Adult Health

## 2020-04-06 ENCOUNTER — Telehealth: Payer: Self-pay | Admitting: Adult Health

## 2020-04-06 DIAGNOSIS — F411 Generalized anxiety disorder: Secondary | ICD-10-CM

## 2020-04-06 DIAGNOSIS — F331 Major depressive disorder, recurrent, moderate: Secondary | ICD-10-CM

## 2020-04-06 DIAGNOSIS — F909 Attention-deficit hyperactivity disorder, unspecified type: Secondary | ICD-10-CM

## 2020-04-06 MED ORDER — BUSPIRONE HCL 10 MG PO TABS: 10.0000 mg | ORAL_TABLET | Freq: Three times a day (TID) | ORAL | 5 refills | Status: DC

## 2020-04-06 MED ORDER — ESCITALOPRAM OXALATE 20 MG PO TABS: 20.0000 mg | ORAL_TABLET | Freq: Every day | ORAL | 5 refills | Status: DC

## 2020-04-06 MED ORDER — AMPHETAMINE-DEXTROAMPHET ER 30 MG PO CP24: 30.0000 mg | ORAL_CAPSULE | Freq: Every day | ORAL | 0 refills | Status: DC

## 2020-04-06 NOTE — Telephone Encounter (Signed)
Scripts sent

## 2020-04-06 NOTE — Telephone Encounter (Signed)
Devin Walker called to make appt which is Scheduled for Monday, 12/13.  He also needs a refill of his Adderall.  [facility] send to [facility] on [address].

## 2020-04-10 ENCOUNTER — Ambulatory Visit: Payer: 59 | Admitting: Adult Health

## 2020-05-05 ENCOUNTER — Other Ambulatory Visit: Payer: Self-pay | Admitting: Adult Health

## 2020-05-05 ENCOUNTER — Telehealth: Payer: Self-pay | Admitting: Adult Health

## 2020-05-05 DIAGNOSIS — F909 Attention-deficit hyperactivity disorder, unspecified type: Secondary | ICD-10-CM

## 2020-05-05 MED ORDER — AMPHETAMINE-DEXTROAMPHET ER 30 MG PO CP24: 30.0000 mg | ORAL_CAPSULE | Freq: Every day | ORAL | 0 refills | Status: DC

## 2020-05-05 NOTE — Telephone Encounter (Signed)
Pt requesting refill for Adderall @ [facility]. Will be out tomorrow. Apt 1/24

## 2020-05-05 NOTE — Telephone Encounter (Signed)
Script sent  

## 2020-05-22 ENCOUNTER — Encounter: Payer: Self-pay | Admitting: Adult Health

## 2020-05-22 ENCOUNTER — Telehealth (INDEPENDENT_AMBULATORY_CARE_PROVIDER_SITE_OTHER): Payer: 59 | Admitting: Adult Health

## 2020-05-22 DIAGNOSIS — G4700 Insomnia, unspecified: Secondary | ICD-10-CM

## 2020-05-22 DIAGNOSIS — F331 Major depressive disorder, recurrent, moderate: Secondary | ICD-10-CM

## 2020-05-22 DIAGNOSIS — F411 Generalized anxiety disorder: Secondary | ICD-10-CM

## 2020-05-22 DIAGNOSIS — F909 Attention-deficit hyperactivity disorder, unspecified type: Secondary | ICD-10-CM

## 2020-05-22 MED ORDER — AMPHETAMINE-DEXTROAMPHET ER 30 MG PO CP24: 30.0000 mg | ORAL_CAPSULE | Freq: Every day | ORAL | 0 refills | Status: DC

## 2020-05-22 NOTE — Progress Notes (Signed)
Zayquan Bogard [id number] [birth year] 29 y.o.  Virtual Visit via Video Note  I connected with pt @ on 05/22/20 at  2:40 PM EST by a video enabled telemedicine application and verified that I am speaking with the correct person using two identifiers.   I discussed the limitations of evaluation and management by telemedicine and the availability of in person appointments. The patient expressed understanding and agreed to proceed.  I discussed the assessment and treatment plan with the patient. The patient was provided an opportunity to ask questions and all were answered. The patient agreed with the plan and demonstrated an understanding of the instructions.   The patient was advised to call back or seek an in-person evaluation if the symptoms worsen or if the condition fails to improve as anticipated.  I provided 31 minutes of non-face-to-face time during this encounter.  The patient was located at home.  The provider was located at [facility].   Dorothyann Gibbs, NP   Subjective:   Patient ID:  Devin Walker is a 30 y.o. (DOB [birth year]) male.  Chief Complaint: No chief complaint on file.   HPI Devin Walker presents for follow-up of GAD, MDD, insomnia, and ADHD.  Describes mood today as "ok". Pleasant. Mood symptoms - denies depression, anxiety, and irritability. Mood is "level". Stating "I'm doing better than I ever have". He and family had Covid over Christmas.new job. Work going well - feeling more comfortable. Stable interest and motivation. Taking medications a prescribed.  Energy levels stable. Active, has a regular exercise routine.  Enjoys some usual interests and activities. Married. Lives with wife of 9 years and 4 children. Mother local and supportive.  Appetite adequate. Weight stable - 143 pounds. Sleeps well most nights. Averages 6 to 7 hours. Focus and concentration stable. Completing tasks. Managing aspects of household. Work going well.  Denies SI or HI.   Denies AH or VH.   Review of Systems:  Review of Systems  Musculoskeletal: Negative for gait problem.  Neurological: Negative for tremors.  Psychiatric/Behavioral:       Please refer to HPI    Medications: I have reviewed the patient's current medications.  Current Outpatient Medications  Medication Sig Dispense Refill  . amphetamine-dextroamphetamine (ADDERALL XR) 30 MG 24 hr capsule Take 1 capsule (30 mg total) by mouth daily. 30 capsule 0  . [START ON 06/19/2020] amphetamine-dextroamphetamine (ADDERALL XR) 30 MG 24 hr capsule Take 1 capsule (30 mg total) by mouth daily. 30 capsule 0  . [START ON 07/17/2020] amphetamine-dextroamphetamine (ADDERALL XR) 30 MG 24 hr capsule Take 1 capsule (30 mg total) by mouth daily. 30 capsule 0  . busPIRone (BUSPAR) 10 MG tablet Take 1 tablet (10 mg total) by mouth 3 (three) times daily. 90 tablet 5  . escitalopram (LEXAPRO) 20 MG tablet Take 1 tablet (20 mg total) by mouth daily. 30 tablet 5   No current facility-administered medications for this visit.    Medication Side Effects: None  Allergies: No Known Allergies  No past medical history on file.  No family history on file.  Social History   Socioeconomic History  . Marital status: Married    Spouse name: Not on file  . Number of children: Not on file  . Years of education: Not on file  . Highest education level: Not on file  Occupational History  . Not on file  Tobacco Use  . Smoking status: Unknown If Ever Smoked  . Smokeless tobacco: Never Used  Substance and Sexual Activity  .  Alcohol use: Not on file  . Drug use: Not on file  . Sexual activity: Not on file  Other Topics Concern  . Not on file  Social History Narrative  . Not on file   Social Determinants of Health   Financial Resource Strain: Not on file  Food Insecurity: Not on file  Transportation Needs: Not on file  Physical Activity: Not on file  Stress: Not on file  Social Connections: Not on file   Intimate Partner Violence: Not on file    Past Medical History, Surgical history, Social history, and Family history were reviewed and updated as appropriate.   Please see review of systems for further details on the patient's review from today.   Objective:   Physical Exam:  There were no vitals taken for this visit.  Physical Exam Constitutional:      General: He is not in acute distress. Musculoskeletal:        General: No deformity.  Neurological:     Mental Status: He is alert and oriented to person, place, and time.     Coordination: Coordination normal.  Psychiatric:        Attention and Perception: Attention and perception normal. He does not perceive auditory or visual hallucinations.        Mood and Affect: Mood normal. Mood is not anxious or depressed. Affect is not labile, blunt, angry or inappropriate.        Speech: Speech normal.        Behavior: Behavior normal.        Thought Content: Thought content normal. Thought content is not paranoid or delusional. Thought content does not include homicidal or suicidal ideation. Thought content does not include homicidal or suicidal plan.        Cognition and Memory: Cognition and memory normal.        Judgment: Judgment normal.     Comments: Insight intact     Lab Review:  No results found for: NA, K, CL, CO2, GLUCOSE, BUN, CREATININE, CALCIUM, PROT, ALBUMIN, AST, ALT, ALKPHOS, BILITOT, GFRNONAA, GFRAA  No results found for: WBC, RBC, HGB, HCT, PLT, MCV, MCH, MCHC, RDW, LYMPHSABS, MONOABS, EOSABS, BASOSABS  No results found for: POCLITH, LITHIUM   No results found for: PHENYTOIN, PHENOBARB, VALPROATE, CBMZ   .res Assessment: Plan:    Plan:  1. Lexapro 20mg  daily 2. Adderall XR 30mg  daily 3. Buspar 10mg  TID  RTC 3 months  Patient advised to contact office with any questions, adverse effects, or acute worsening in signs and symptoms.  Discussed potential benefits, risks, and side effects of stimulants with  patient to include increased heart rate, palpitations, insomnia, increased anxiety, increased irritability, or decreased appetite.  Instructed patient to contact office if experiencing any significant tolerability issues.   Diagnoses and all orders for this visit:  Insomnia, unspecified type  Attention deficit hyperactivity disorder (ADHD), unspecified ADHD type -     amphetamine-dextroamphetamine (ADDERALL XR) 30 MG 24 hr capsule; Take 1 capsule (30 mg total) by mouth daily. -     amphetamine-dextroamphetamine (ADDERALL XR) 30 MG 24 hr capsule; Take 1 capsule (30 mg total) by mouth daily. -     amphetamine-dextroamphetamine (ADDERALL XR) 30 MG 24 hr capsule; Take 1 capsule (30 mg total) by mouth daily.  Major depressive disorder, recurrent episode, moderate (HCC)  Generalized anxiety disorder     Please see After Visit Summary for patient specific instructions.  No future appointments.  No orders of the defined types were  placed in this encounter.     -------------------------------

## 2020-06-13 ENCOUNTER — Other Ambulatory Visit: Payer: Self-pay | Admitting: Adult Health

## 2020-06-13 ENCOUNTER — Telehealth: Payer: Self-pay | Admitting: Adult Health

## 2020-06-13 DIAGNOSIS — F909 Attention-deficit hyperactivity disorder, unspecified type: Secondary | ICD-10-CM

## 2020-06-13 MED ORDER — AMPHETAMINE-DEXTROAMPHET ER 30 MG PO CP24: 30.0000 mg | ORAL_CAPSULE | Freq: Every day | ORAL | 0 refills | Status: DC

## 2020-06-13 NOTE — Telephone Encounter (Signed)
Pt called back to advise he can't get new Rx filled for generic Adderall. He just filled brand 06/05/20. He can only fill another med for ADHD per pharmacy. Pt has never taken any other med and asking if you would send in another med until he can fill generic Adderall Rx. He is really not feeling well and  Willing to try anything at this point.

## 2020-06-13 NOTE — Telephone Encounter (Signed)
Script sent  

## 2020-06-13 NOTE — Telephone Encounter (Signed)
Pt called to advise he has notice over time with taking Adderall Brand, due to insurance. He has issues foggy headed, distracted easily, and irriated easily. Wife has mentioned as well. He remembers as a child having issues with brand adderall. Pt asking to change to generic. [facility]. Need to change to generic. Pt willing to pay out of pocket if have to. Contact @ [phone] if any questions.

## 2020-06-13 NOTE — Telephone Encounter (Signed)
We can send to a different pharmacy.

## 2020-06-14 ENCOUNTER — Other Ambulatory Visit: Payer: Self-pay | Admitting: Adult Health

## 2020-06-14 DIAGNOSIS — F909 Attention-deficit hyperactivity disorder, unspecified type: Secondary | ICD-10-CM

## 2020-06-14 MED ORDER — LISDEXAMFETAMINE DIMESYLATE 30 MG PO CAPS: 30.0000 mg | ORAL_CAPSULE | Freq: Every day | ORAL | 0 refills | Status: DC

## 2020-06-14 NOTE — Telephone Encounter (Signed)
The pharmacy confirmed he will need a prior auth.

## 2020-06-14 NOTE — Telephone Encounter (Signed)
Devin Walker will fill a different med like Vyvanse, Concerta etc. They just can't fill Adderall of any type, due to fill date was 06/05/20. Too soon and controlled. So that date would show on any pharmacy list. Wouldn't it?

## 2020-06-14 NOTE — Telephone Encounter (Signed)
Thanks for checking I will submit that now

## 2020-06-14 NOTE — Telephone Encounter (Signed)
I went ahead and sent PA it was quick questions and marked URGENT

## 2020-06-14 NOTE — Telephone Encounter (Signed)
Surprisingly we reached an APPROVAL already for his VYVANSE

## 2020-06-14 NOTE — Telephone Encounter (Signed)
Sheralyn Boatman can you let Corneilus know he can now get his Vyvanse. Not sure the cost but the PA is approved

## 2020-06-14 NOTE — Telephone Encounter (Signed)
Insurance does require a PA for Vyvanse, I'm happy to submit but not sure the turn around time.     Medication on his plan that that do NOT need a PA is : Guanfacine Atomoxetine Methylphenidate

## 2020-06-14 NOTE — Telephone Encounter (Signed)
VM is full I will try to call back

## 2020-06-14 NOTE — Telephone Encounter (Signed)
Can we advise patient?

## 2020-06-14 NOTE — Telephone Encounter (Signed)
Prior authorization approved effective 06/14/2020-06/13/2021 for [facility] with [facility] [employer]

## 2020-06-14 NOTE — Telephone Encounter (Signed)
Vyvanse seems to always need a PA. Any older med you can send that may not need PA?

## 2020-06-14 NOTE — Telephone Encounter (Signed)
We can try Vyvase, but not sure if insurance will cover. Will send in a 30mg  script.

## 2020-06-14 NOTE — Telephone Encounter (Signed)
Devin Walker can you check with his pharmacy to see if he needs to have a prior authorization on his Vyvanse?

## 2020-06-15 NOTE — Telephone Encounter (Signed)
Noted  

## 2020-06-15 NOTE — Telephone Encounter (Signed)
Pt has been informed.

## 2020-07-28 ENCOUNTER — Other Ambulatory Visit: Payer: Self-pay | Admitting: Adult Health

## 2020-07-28 DIAGNOSIS — F331 Major depressive disorder, recurrent, moderate: Secondary | ICD-10-CM

## 2020-07-28 DIAGNOSIS — F411 Generalized anxiety disorder: Secondary | ICD-10-CM

## 2020-07-28 NOTE — Telephone Encounter (Signed)
Next visit is 08/29/20. Requesting refill on Lexapro called to:  [facility] [place], [place] - [address] Phone:  [phone]  Fax:  [phone]

## 2020-08-29 ENCOUNTER — Telehealth (INDEPENDENT_AMBULATORY_CARE_PROVIDER_SITE_OTHER): Payer: 59 | Admitting: Adult Health

## 2020-08-29 ENCOUNTER — Encounter: Payer: Self-pay | Admitting: Adult Health

## 2020-08-29 DIAGNOSIS — F331 Major depressive disorder, recurrent, moderate: Secondary | ICD-10-CM

## 2020-08-29 DIAGNOSIS — F909 Attention-deficit hyperactivity disorder, unspecified type: Secondary | ICD-10-CM

## 2020-08-29 DIAGNOSIS — F411 Generalized anxiety disorder: Secondary | ICD-10-CM | POA: Diagnosis not present

## 2020-08-29 MED ORDER — BUSPIRONE HCL 10 MG PO TABS: 10.0000 mg | ORAL_TABLET | Freq: Three times a day (TID) | ORAL | 5 refills | Status: DC

## 2020-08-29 MED ORDER — ESCITALOPRAM OXALATE 20 MG PO TABS: 20.0000 mg | ORAL_TABLET | Freq: Every day | ORAL | 5 refills | Status: DC

## 2020-08-29 MED ORDER — AMPHETAMINE-DEXTROAMPHET ER 15 MG PO CP24: 30.0000 mg | ORAL_CAPSULE | Freq: Two times a day (BID) | ORAL | 0 refills | Status: DC

## 2020-08-29 NOTE — Progress Notes (Deleted)
Devin Walker [id number] May 21, 1990 30 y.o.  Subjective:   Patient ID:  Buel Molder is a 30 y.o. (DOB Sep 01, 1990) male.  Chief Complaint: No chief complaint on file.   HPI Deven Furia presents to the office today for follow-up of GAD, MDD, insomnia, and ADHD.  Describes mood today as "ok". Pleasant. Mood symptoms - denies depression, anxiety, and irritability. Mood is "level". Stating "I'm doing really good". He and family doing well. Work going well - "progressing". Stable interest and motivation. Taking medications a prescribed.  Energy levels stable. Active, has a regular exercise routine.  Enjoys some usual interests and activities. Married. Lives with wife and 4 children. Mother local and supportive.  Appetite adequate. Weight stable - 143 pounds. Sleeps well most nights. Averages 6 to 7 hours. Focus and concentration stable. Completing tasks. Managing aspects of household. Work going well.  Denies SI or HI.  Denies AH or VH.    Review of Systems:  Review of Systems  Musculoskeletal: Negative for gait problem.  Neurological: Negative for tremors.  Psychiatric/Behavioral:       Please refer to HPI    Medications: I have reviewed the patient's current medications.  Current Outpatient Medications  Medication Sig Dispense Refill  . amphetamine-dextroamphetamine (ADDERALL XR) 15 MG 24 hr capsule Take 2 capsules by mouth 2 (two) times daily. 60 capsule 0  . amphetamine-dextroamphetamine (ADDERALL XR) 30 MG 24 hr capsule Take 1 capsule (30 mg total) by mouth daily. 30 capsule 0  . amphetamine-dextroamphetamine (ADDERALL XR) 30 MG 24 hr capsule Take 1 capsule (30 mg total) by mouth daily. 30 capsule 0  . busPIRone (BUSPAR) 10 MG tablet Take 1 tablet (10 mg total) by mouth 3 (three) times daily. 90 tablet 5  . escitalopram (LEXAPRO) 20 MG tablet Take 1 tablet (20 mg total) by mouth daily. 30 tablet 5  . lisdexamfetamine (VYVANSE) 30 MG capsule Take 1 capsule (30 mg total) by mouth  daily. 30 capsule 0   No current facility-administered medications for this visit.    Medication Side Effects: None  Allergies: No Known Allergies  No past medical history on file.  Past Medical History, Surgical history, Social history, and Family history were reviewed and updated as appropriate.   Please see review of systems for further details on the patient's review from today.   Objective:   Physical Exam:  There were no vitals taken for this visit.  Physical Exam Constitutional:      General: He is not in acute distress. Musculoskeletal:        General: No deformity.  Neurological:     Mental Status: He is alert and oriented to person, place, and time.     Coordination: Coordination normal.  Psychiatric:        Attention and Perception: Attention and perception normal. He does not perceive auditory or visual hallucinations.        Mood and Affect: Mood normal. Mood is not anxious or depressed. Affect is not labile, blunt, angry or inappropriate.        Speech: Speech normal.        Behavior: Behavior normal.        Thought Content: Thought content normal. Thought content is not paranoid or delusional. Thought content does not include homicidal or suicidal ideation. Thought content does not include homicidal or suicidal plan.        Cognition and Memory: Cognition and memory normal.        Judgment: Judgment normal.  Comments: Insight intact     Lab Review:  No results found for: NA, K, CL, CO2, GLUCOSE, BUN, CREATININE, CALCIUM, PROT, ALBUMIN, AST, ALT, ALKPHOS, BILITOT, GFRNONAA, GFRAA  No results found for: WBC, RBC, HGB, HCT, PLT, MCV, MCH, MCHC, RDW, LYMPHSABS, MONOABS, EOSABS, BASOSABS  No results found for: POCLITH, LITHIUM   No results found for: PHENYTOIN, PHENOBARB, VALPROATE, CBMZ   .res Assessment: Plan:     Plan:  1. Lexapro 20mg  daily 2. Adderall XR 30mg  daily - will switch to XR 15mg  BID 3. Buspar 10mg  TID  RTC 3 months  Patient  advised to contact office with any questions, adverse effects, or acute worsening in signs and symptoms.  Discussed potential benefits, risks, and side effects of stimulants with patient to include increased heart rate, palpitations, insomnia, increased anxiety, increased irritability, or decreased appetite.  Instructed patient to contact office if experiencing any significant tolerability issues.    Diagnoses and all orders for this visit:  Generalized anxiety disorder -     escitalopram (LEXAPRO) 20 MG tablet; Take 1 tablet (20 mg total) by mouth daily. -     busPIRone (BUSPAR) 10 MG tablet; Take 1 tablet (10 mg total) by mouth 3 (three) times daily.  Major depressive disorder, recurrent episode, moderate (HCC) -     escitalopram (LEXAPRO) 20 MG tablet; Take 1 tablet (20 mg total) by mouth daily.  Attention deficit hyperactivity disorder (ADHD), unspecified ADHD type -     amphetamine-dextroamphetamine (ADDERALL XR) 15 MG 24 hr capsule; Take 2 capsules by mouth 2 (two) times daily.     Please see After Visit Summary for patient specific instructions.  No future appointments.  No orders of the defined types were placed in this encounter.   -------------------------------

## 2020-08-31 NOTE — Progress Notes (Signed)
Devin Walker [id number] [birth year] 30 y.o.  Virtual Visit via Telephone Note  I connected with pt on 08/31/20 at 11:40 AM EDT by telephone and verified that I am speaking with the correct person using two identifiers.   I discussed the limitations, risks, security and privacy concerns of performing an evaluation and management service by telephone and the availability of in person appointments. I also discussed with the patient that there may be a patient responsible charge related to this service. The patient expressed understanding and agreed to proceed.   I discussed the assessment and treatment plan with the patient. The patient was provided an opportunity to ask questions and all were answered. The patient agreed with the plan and demonstrated an understanding of the instructions.   The patient was advised to call back or seek an in-person evaluation if the symptoms worsen or if the condition fails to improve as anticipated.  I provided 20 minutes of non-face-to-face time during this encounter.  The patient was located at home.  The provider was located at [facility].   Dorothyann Gibbs, NP   Subjective:   Patient ID:  Devin Walker is a 30 y.o. (DOB 1990/11/08) male.  Chief Complaint: No chief complaint on file.   HPI Devin Walker presents for follow-up of GAD, MDD and ADHD.  Describes mood today as "ok". Pleasant. Mood symptoms - denies depression, anxiety, and irritability. Mood is "level". Stating "I'm doing really good". He and family doing well. Work going well - "progressing". Stable interest and motivation. Taking medications a prescribed.  Energy levels stable. Active, has a regular exercise routine.  Enjoys some usual interests and activities. Married. Lives with wife and 4 children. Mother local and supportive.  Appetite adequate. Weight stable - 143 pounds. Sleeps well most nights. Averages 6 to 7 hours. Focus and concentration stable. Completing tasks.  Managing aspects of household. Work going well.  Denies SI or HI.  Denies AH or VH.   Review of Systems:  Review of Systems  Musculoskeletal: Negative for gait problem.  Neurological: Negative for tremors.  Psychiatric/Behavioral:       Please refer to HPI    Medications: I have reviewed the patient's current medications.  Current Outpatient Medications  Medication Sig Dispense Refill  . amphetamine-dextroamphetamine (ADDERALL XR) 15 MG 24 hr capsule Take 2 capsules by mouth 2 (two) times daily. 60 capsule 0  . amphetamine-dextroamphetamine (ADDERALL XR) 30 MG 24 hr capsule Take 1 capsule (30 mg total) by mouth daily. 30 capsule 0  . amphetamine-dextroamphetamine (ADDERALL XR) 30 MG 24 hr capsule Take 1 capsule (30 mg total) by mouth daily. 30 capsule 0  . busPIRone (BUSPAR) 10 MG tablet Take 1 tablet (10 mg total) by mouth 3 (three) times daily. 90 tablet 5  . escitalopram (LEXAPRO) 20 MG tablet Take 1 tablet (20 mg total) by mouth daily. 30 tablet 5  . lisdexamfetamine (VYVANSE) 30 MG capsule Take 1 capsule (30 mg total) by mouth daily. 30 capsule 0   No current facility-administered medications for this visit.    Medication Side Effects: None  Allergies: No Known Allergies  No past medical history on file.  No family history on file.  Social History   Socioeconomic History  . Marital status: Married    Spouse name: Not on file  . Number of children: Not on file  . Years of education: Not on file  . Highest education level: Not on file  Occupational History  . Not on file  Tobacco Use  . Smoking status: Unknown If Ever Smoked  . Smokeless tobacco: Never Used  Substance and Sexual Activity  . Alcohol use: Not on file  . Drug use: Not on file  . Sexual activity: Not on file  Other Topics Concern  . Not on file  Social History Narrative  . Not on file   Social Determinants of Health   Financial Resource Strain: Not on file  Food Insecurity: Not on file   Transportation Needs: Not on file  Physical Activity: Not on file  Stress: Not on file  Social Connections: Not on file  Intimate Partner Violence: Not on file    Past Medical History, Surgical history, Social history, and Family history were reviewed and updated as appropriate.   Please see review of systems for further details on the patient's review from today.   Objective:   Physical Exam:  There were no vitals taken for this visit.  Physical Exam Constitutional:      General: He is not in acute distress. Musculoskeletal:        General: No deformity.  Neurological:     Mental Status: He is alert and oriented to person, place, and time.     Coordination: Coordination normal.  Psychiatric:        Attention and Perception: Attention and perception normal. He does not perceive auditory or visual hallucinations.        Mood and Affect: Mood normal. Mood is not anxious or depressed. Affect is not labile, blunt, angry or inappropriate.        Speech: Speech normal.        Behavior: Behavior normal.        Thought Content: Thought content normal. Thought content is not paranoid or delusional. Thought content does not include homicidal or suicidal ideation. Thought content does not include homicidal or suicidal plan.        Cognition and Memory: Cognition and memory normal.        Judgment: Judgment normal.     Comments: Insight intact     Lab Review:  No results found for: NA, K, CL, CO2, GLUCOSE, BUN, CREATININE, CALCIUM, PROT, ALBUMIN, AST, ALT, ALKPHOS, BILITOT, GFRNONAA, GFRAA  No results found for: WBC, RBC, HGB, HCT, PLT, MCV, MCH, MCHC, RDW, LYMPHSABS, MONOABS, EOSABS, BASOSABS  No results found for: POCLITH, LITHIUM   No results found for: PHENYTOIN, PHENOBARB, VALPROATE, CBMZ   .res Assessment: Plan:     Plan:  1. Lexapro 20mg  daily 2. Adderall XR 30mg  daily - will switch to XR 15mg  BID 3. Buspar 10mg  TID  RTC 3 months  Patient advised to contact  office with any questions, adverse effects, or acute worsening in signs and symptoms.  Discussed potential benefits, risks, and side effects of stimulants with patient to include increased heart rate, palpitations, insomnia, increased anxiety, increased irritability, or decreased appetite.  Instructed patient to contact office if experiencing any significant tolerability issues.      Diagnoses and all orders for this visit:  Generalized anxiety disorder -     escitalopram (LEXAPRO) 20 MG tablet; Take 1 tablet (20 mg total) by mouth daily. -     busPIRone (BUSPAR) 10 MG tablet; Take 1 tablet (10 mg total) by mouth 3 (three) times daily.  Major depressive disorder, recurrent episode, moderate (HCC) -     escitalopram (LEXAPRO) 20 MG tablet; Take 1 tablet (20 mg total) by mouth daily.  Attention deficit hyperactivity disorder (ADHD), unspecified ADHD type -  amphetamine-dextroamphetamine (ADDERALL XR) 15 MG 24 hr capsule; Take 2 capsules by mouth 2 (two) times daily.    Please see After Visit Summary for patient specific instructions.  No future appointments.  No orders of the defined types were placed in this encounter.     -------------------------------

## 2020-09-15 ENCOUNTER — Telehealth: Payer: Self-pay | Admitting: Adult Health

## 2020-09-15 NOTE — Telephone Encounter (Signed)
Pt stated he took this week off of work due to needing some time for his mental health.He is requesting you write a letter to his employer stating to excuse his absence due to mental health reasons.I asked him did his job require FMLA paperwork or if a letter will be enough.He will call back to let us know.He said he will return to work Monday.

## 2020-09-15 NOTE — Telephone Encounter (Signed)
Noted  

## 2020-09-15 NOTE — Telephone Encounter (Signed)
Pt LM on VM requesting a note for work stating absence due to mental reasons. Asking for return call from Provider RM @ [phone]

## 2020-09-18 NOTE — Telephone Encounter (Signed)
Pt called reporting he needs letter for work with dates 5/16-5/20 only to return to work 5/23. Due to mental health reasons. No FMLA Paperwork needed.Contact # for pt if questions [phone]

## 2020-09-18 NOTE — Telephone Encounter (Signed)
Yes

## 2020-09-18 NOTE — Telephone Encounter (Signed)
Pt requesting letter with information noted, okay for someone to type up?

## 2020-09-19 NOTE — Telephone Encounter (Signed)
Devin Walker has this been done?

## 2020-09-20 NOTE — Telephone Encounter (Signed)
Letter signed

## 2020-10-04 ENCOUNTER — Other Ambulatory Visit: Payer: Self-pay

## 2020-10-04 ENCOUNTER — Other Ambulatory Visit: Payer: Self-pay | Admitting: Adult Health

## 2020-10-04 ENCOUNTER — Telehealth: Payer: Self-pay | Admitting: Adult Health

## 2020-10-04 DIAGNOSIS — F909 Attention-deficit hyperactivity disorder, unspecified type: Secondary | ICD-10-CM

## 2020-10-04 MED ORDER — AMPHETAMINE-DEXTROAMPHET ER 15 MG PO CP24: ORAL_CAPSULE | ORAL | 0 refills | Status: DC

## 2020-10-04 NOTE — Telephone Encounter (Signed)
Manual called to request refill of his Adderall 15 mg.  Send to [facility] on [address]  Will call to make next appt.  Due back in August.

## 2020-10-04 NOTE — Telephone Encounter (Signed)
Pended.

## 2020-11-03 ENCOUNTER — Other Ambulatory Visit: Payer: Self-pay

## 2020-11-03 ENCOUNTER — Telehealth: Payer: Self-pay | Admitting: Adult Health

## 2020-11-03 DIAGNOSIS — F909 Attention-deficit hyperactivity disorder, unspecified type: Secondary | ICD-10-CM

## 2020-11-03 MED ORDER — AMPHETAMINE-DEXTROAMPHET ER 15 MG PO CP24: ORAL_CAPSULE | ORAL | 0 refills | Status: DC

## 2020-11-03 NOTE — Telephone Encounter (Signed)
Pended.

## 2020-11-03 NOTE — Telephone Encounter (Signed)
Pt called and he needs a refill on his adderall xr 15 mg to be sent to the [facility]  on [address]

## 2020-11-29 ENCOUNTER — Ambulatory Visit: Payer: 59 | Admitting: Adult Health

## 2020-12-01 ENCOUNTER — Encounter: Payer: Self-pay | Admitting: Adult Health

## 2020-12-01 ENCOUNTER — Telehealth (INDEPENDENT_AMBULATORY_CARE_PROVIDER_SITE_OTHER): Payer: 59 | Admitting: Adult Health

## 2020-12-01 DIAGNOSIS — F331 Major depressive disorder, recurrent, moderate: Secondary | ICD-10-CM | POA: Diagnosis not present

## 2020-12-01 DIAGNOSIS — F909 Attention-deficit hyperactivity disorder, unspecified type: Secondary | ICD-10-CM | POA: Diagnosis not present

## 2020-12-01 DIAGNOSIS — F411 Generalized anxiety disorder: Secondary | ICD-10-CM | POA: Diagnosis not present

## 2020-12-01 MED ORDER — ESCITALOPRAM OXALATE 20 MG PO TABS: 20.0000 mg | ORAL_TABLET | Freq: Every day | ORAL | 5 refills | Status: DC

## 2020-12-01 MED ORDER — AMPHETAMINE-DEXTROAMPHET ER 15 MG PO CP24: 15.0000 mg | ORAL_CAPSULE | Freq: Two times a day (BID) | ORAL | 0 refills | Status: DC

## 2020-12-01 MED ORDER — BUSPIRONE HCL 10 MG PO TABS: 10.0000 mg | ORAL_TABLET | Freq: Three times a day (TID) | ORAL | 5 refills | Status: DC

## 2020-12-01 MED ORDER — AMPHETAMINE-DEXTROAMPHET ER 15 MG PO CP24: ORAL_CAPSULE | ORAL | 0 refills | Status: DC

## 2020-12-01 NOTE — Progress Notes (Signed)
Jerimyah Vandunk [id number] [birth year] 30 y.o.  Subjective:   Patient ID:  Devin Walker is a 30 y.o. (DOB Feb 01, 1991) male.  Chief Complaint: No chief complaint on file.   HPI Devin Walker presents to the office today for follow-up of GAD, MDD and ADHD.  Describes mood today as "ok". Pleasant. Mood symptoms - denies depression, anxiety, and irritability. Mood is good. Stating "I feel good and stable".  Feels like medications are helpful. Work going well - "extremely stressful". Stable interest and motivation. Taking medications a prescribed.  Energy levels stable. Active, has a regular exercise routine.  Enjoys some usual interests and activities. Married. Lives with wife and 4 children. Mother local and supportive.  Appetite adequate. Weight stable - 143 pounds. Sleeps well most nights. Averages 6 to 7 hours. Focus and concentration stable. Completing tasks. Managing aspects of household. Works full time. Denies SI or HI.  Denies AH or VH.   Review of Systems:  Review of Systems  Musculoskeletal:  Negative for gait problem.  Neurological:  Negative for tremors.  Psychiatric/Behavioral:         Please refer to HPI   Medications: I have reviewed the patient's current medications.  Current Outpatient Medications  Medication Sig Dispense Refill   [START ON 12/29/2020] amphetamine-dextroamphetamine (ADDERALL XR) 15 MG 24 hr capsule Take 1 capsule by mouth 2 (two) times daily. 60 capsule 0   [START ON 01/26/2021] amphetamine-dextroamphetamine (ADDERALL XR) 15 MG 24 hr capsule Take 1 capsule by mouth 2 (two) times daily. 60 capsule 0   amphetamine-dextroamphetamine (ADDERALL XR) 15 MG 24 hr capsule Take one capsule twice daily. 60 capsule 0   busPIRone (BUSPAR) 10 MG tablet Take 1 tablet (10 mg total) by mouth 3 (three) times daily. 90 tablet 5   escitalopram (LEXAPRO) 20 MG tablet Take 1 tablet (20 mg total) by mouth daily. 30 tablet 5   No current facility-administered medications for this  visit.    Medication Side Effects: None  Allergies: No Known Allergies  No past medical history on file.  Past Medical History, Surgical history, Social history, and Family history were reviewed and updated as appropriate.   Please see review of systems for further details on the patient's review from today.   Objective:   Physical Exam:  There were no vitals taken for this visit.  Physical Exam Constitutional:      General: He is not in acute distress. Musculoskeletal:        General: No deformity.  Neurological:     Mental Status: He is alert and oriented to person, place, and time.     Coordination: Coordination normal.  Psychiatric:        Attention and Perception: Attention and perception normal. He does not perceive auditory or visual hallucinations.        Mood and Affect: Mood normal. Mood is not anxious or depressed. Affect is not labile, blunt, angry or inappropriate.        Speech: Speech normal.        Behavior: Behavior normal.        Thought Content: Thought content normal. Thought content is not paranoid or delusional. Thought content does not include homicidal or suicidal ideation. Thought content does not include homicidal or suicidal plan.        Cognition and Memory: Cognition and memory normal.        Judgment: Judgment normal.     Comments: Insight intact    Lab Review:  No results found for:  NA, K, CL, CO2, GLUCOSE, BUN, CREATININE, CALCIUM, PROT, ALBUMIN, AST, ALT, ALKPHOS, BILITOT, GFRNONAA, GFRAA  No results found for: WBC, RBC, HGB, HCT, PLT, MCV, MCH, MCHC, RDW, LYMPHSABS, MONOABS, EOSABS, BASOSABS  No results found for: POCLITH, LITHIUM   No results found for: PHENYTOIN, PHENOBARB, VALPROATE, CBMZ   .res Assessment: Plan:    Plan:  1. Lexapro 20mg  daily 2. Adderall XR 15mg  BID 3. Buspar 10mg  TID  RTC 3 months  Patient advised to contact office with any questions, adverse effects, or acute worsening in signs and  symptoms.  Discussed potential benefits, risks, and side effects of stimulants with patient to include increased heart rate, palpitations, insomnia, increased anxiety, increased irritability, or decreased appetite.  Instructed patient to contact office if experiencing any significant tolerability issues.    Diagnoses and all orders for this visit:  Attention deficit hyperactivity disorder (ADHD), unspecified ADHD type -     amphetamine-dextroamphetamine (ADDERALL XR) 15 MG 24 hr capsule; Take one capsule twice daily. -     amphetamine-dextroamphetamine (ADDERALL XR) 15 MG 24 hr capsule; Take 1 capsule by mouth 2 (two) times daily. -     amphetamine-dextroamphetamine (ADDERALL XR) 15 MG 24 hr capsule; Take 1 capsule by mouth 2 (two) times daily.  Generalized anxiety disorder -     busPIRone (BUSPAR) 10 MG tablet; Take 1 tablet (10 mg total) by mouth 3 (three) times daily. -     escitalopram (LEXAPRO) 20 MG tablet; Take 1 tablet (20 mg total) by mouth daily.  Major depressive disorder, recurrent episode, moderate (HCC) -     escitalopram (LEXAPRO) 20 MG tablet; Take 1 tablet (20 mg total) by mouth daily.    Please see After Visit Summary for patient specific instructions.  Future Appointments  Date Time Provider Department Center  12/04/2020  4:00 PM , [facility] None    No orders of the defined types were placed in this encounter.   -------------------------------

## 2020-12-04 ENCOUNTER — Encounter: Payer: Self-pay | Admitting: Psychiatry

## 2020-12-04 ENCOUNTER — Ambulatory Visit (INDEPENDENT_AMBULATORY_CARE_PROVIDER_SITE_OTHER): Payer: 59 | Admitting: Psychiatry

## 2020-12-04 ENCOUNTER — Other Ambulatory Visit: Payer: Self-pay

## 2020-12-04 DIAGNOSIS — F411 Generalized anxiety disorder: Secondary | ICD-10-CM | POA: Diagnosis not present
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
## 2020-12-27 ENCOUNTER — Ambulatory Visit: Payer: 59 | Admitting: Psychiatry

## 2020-12-27 DIAGNOSIS — F411 Generalized anxiety disorder: Secondary | ICD-10-CM

## 2021-01-05 NOTE — Progress Notes (Signed)
Patient did not connect to virtual session.  Tried to call but voicemail was full so message could be not left when he did not answer.

## 2021-01-11 ENCOUNTER — Encounter: Payer: Self-pay | Admitting: Adult Health

## 2021-01-11 ENCOUNTER — Ambulatory Visit: Payer: 59 | Admitting: Adult Health

## 2021-01-11 ENCOUNTER — Ambulatory Visit (INDEPENDENT_AMBULATORY_CARE_PROVIDER_SITE_OTHER): Payer: 59 | Admitting: Adult Health

## 2021-01-11 ENCOUNTER — Other Ambulatory Visit: Payer: Self-pay

## 2021-01-11 DIAGNOSIS — F411 Generalized anxiety disorder: Secondary | ICD-10-CM | POA: Diagnosis not present

## 2021-01-11 DIAGNOSIS — F909 Attention-deficit hyperactivity disorder, unspecified type: Secondary | ICD-10-CM | POA: Diagnosis not present

## 2021-01-11 NOTE — Progress Notes (Signed)
Devin Walker [id number] [birth year] 30 y.o.  Subjective:   Patient ID:  Devin Walker is a 30 y.o. (DOB [birth year]) male.  Chief Complaint: No chief complaint on file.   HPI Lakendrick Paradis presents to the office today for follow-up of GAD, MDD and ADHD.  Describes mood today as "not good". Pleasant. Flat. Tearful at times. Mood symptoms - reports increased depression, anxiety, and irritability over past several months. Has been in a "dark place" for past several months. Stating "my mind set has declined". Also stating "I have been neglecting myself for several months". Working remotely in Consulting civil engineer. Feels like he has isolated himself from everyone. Working 12 to 16 hours a day. Has stopped doing things with family. Stating "it's really wearing on me". Doesn't feel "functional" at work. Stating "I feel like I'm drowning". Hasn't had the time to prioritize anything but work. Reports a lot of turnover in his department. 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 Has not been able to give his family the attention they deserve. Stating "I fel like I need a hard reset". Stating "there is a lot of pressure on me and not a lot of support". Does not feel like he can continue to work in his current work environment. Is wanting to take a leave of absence for a few months and work on his "frame of mind". Decreased interest and motivation. Taking medications a prescribed. Plans to see therapist. Energy levels decreased. Active, does not have a regular exercise routine.  Enjoys some usual interests and activities. Married. Lives with wife and 4 children. Mother local and supportive.  Appetite adequate. Weight stable - 143 pounds. Sleeps well most nights. Averages 6 to 7 hours. Focus and concentration stable. Completing tasks. Managing aspects of household. Works full time. Denies SI or HI.  Denies AH or VH.   Review of Systems:  Review of Systems  Musculoskeletal:  Negative for gait problem.   Neurological:  Negative for tremors.  Psychiatric/Behavioral:         Please refer to HPI   Medications: I have reviewed the patient's current medications.  Current Outpatient Medications  Medication Sig Dispense Refill   amphetamine-dextroamphetamine (ADDERALL XR) 15 MG 24 hr capsule Take one capsule twice daily. 60 capsule 0   amphetamine-dextroamphetamine (ADDERALL XR) 15 MG 24 hr capsule Take 1 capsule by mouth 2 (two) times daily. 60 capsule 0   [START ON 01/26/2021] amphetamine-dextroamphetamine (ADDERALL XR) 15 MG 24 hr capsule Take 1 capsule by mouth 2 (two) times daily. 60 capsule 0   busPIRone (BUSPAR) 10 MG tablet Take 1 tablet (10 mg total) by mouth 3 (three) times daily. 90 tablet 5   escitalopram (LEXAPRO) 20 MG tablet Take 1 tablet (20 mg total) by mouth daily. 30 tablet 5   No current facility-administered medications for this visit.    Medication Side Effects: None  Allergies: No Known Allergies  No past medical history on file.  Past Medical History, Surgical history, Social history, and Family history were reviewed and updated as appropriate.   Please see review of systems for further details on the patient's review from today.   Objective:   Physical Exam:  There were no vitals taken for this visit.  Physical Exam Constitutional:      General: He is not in acute distress. Musculoskeletal:        General: No deformity.  Neurological:     Mental Status: He is alert and oriented to person, place, and time.  Psychiatric:        Attention and Perception: Attention and perception normal. He does not perceive auditory or visual hallucinations.        Mood and Affect: Mood is anxious and depressed. Affect is flat. Affect is not labile, blunt, angry or inappropriate.        Speech: Speech normal.        Thought Content: Thought content is not paranoid or delusional. Thought content does not include homicidal or suicidal ideation. Thought content does not include  homicidal or suicidal plan.        Cognition and Memory: Cognition and memory normal.     Comments: Insight intact    Lab Review:  No results found for: NA, K, CL, CO2, GLUCOSE, BUN, CREATININE, CALCIUM, PROT, ALBUMIN, AST, ALT, ALKPHOS, BILITOT, GFRNONAA, GFRAA  No results found for: WBC, RBC, HGB, HCT, PLT, MCV, MCH, MCHC, RDW, LYMPHSABS, MONOABS, EOSABS, BASOSABS  No results found for: POCLITH, LITHIUM   No results found for: PHENYTOIN, PHENOBARB, VALPROATE, CBMZ   .res Assessment: Plan:    Plan:  1. Lexapro 20mg  daily 2. Adderall XR 15mg  BID 3. Buspar 10mg  TID  RTC 2 months   Time spent with patient was 30 minutes. Greater than 50% of face to face time with patient was spent on counseling and coordination of care.   Patient totally disabled and unable to work. Patient out of work from 01/11/2021 through 03/13/2021. Will reassess a return to work date at next visit.   Patient advised to contact office with any questions, adverse effects, or acute worsening in signs and symptoms.  Discussed potential benefits, risks, and side effects of stimulants with patient to include increased heart rate, palpitations, insomnia, increased anxiety, increased irritability, or decreased appetite. Instructed patient to contact office if experiencing any significant tolerability issues.  Diagnoses and all orders for this visit:  GAD (generalized anxiety disorder)  Attention deficit hyperactivity disorder (ADHD), unspecified ADHD type  Generalized anxiety disorder    Please see After Visit Summary for patient specific instructions.  Future Appointments  Date Time Provider Department Center  01/29/2021  1:00 PM 01/13/2021, [facility]-CP None  02/19/2021  1:00 PM Stevphen Meuse, [facility] CP-CP None  03/05/2021  1:00 PM Stevphen Meuse, [facility] [place] CP-CP None  03/13/2021  9:40 AM Zyrell Carmean, Stevphen Meuse, NP CP-CP None    No orders of the defined types were placed in this  encounter.   -------------------------------

## 2021-01-16 ENCOUNTER — Ambulatory Visit: Payer: 59 | Admitting: Adult Health

## 2021-01-23 ENCOUNTER — Telehealth: Payer: Self-pay | Admitting: Adult Health

## 2021-01-23 NOTE — Telephone Encounter (Signed)
Received FMLA form. Placed in Traci's box 01/23/21. Patient needs form by 10/1

## 2021-01-24 NOTE — Telephone Encounter (Signed)
Noted thanks Ebonee I will try to get it done for him.

## 2021-01-26 DIAGNOSIS — Z0289 Encounter for other administrative examinations: Secondary | ICD-10-CM

## 2021-01-26 NOTE — Telephone Encounter (Signed)
Forms completed and will have Regina review and sign this morning.

## 2021-01-26 NOTE — Telephone Encounter (Signed)
This was faxed to [facility]

## 2021-01-26 NOTE — Telephone Encounter (Signed)
Noted  

## 2021-01-29 ENCOUNTER — Ambulatory Visit (INDEPENDENT_AMBULATORY_CARE_PROVIDER_SITE_OTHER): Payer: 59 | Admitting: Psychiatry

## 2021-01-29 DIAGNOSIS — F411 Generalized anxiety disorder: Secondary | ICD-10-CM

## 2021-01-29 NOTE — Progress Notes (Signed)
[facility] Counselor/Therapist Progress Note  Patient ID: Devin Walker, MRN: [id number],    Date: 01/29/2021  Time Spent: 51 minutes start time 1:12 PM end time 2:03 PM Virtual Visit via Video Note Connected with patient by a telemedicine/telehealth application, with their informed consent, and verified patient privacy and that I am speaking with the correct person using two identifiers. I discussed the limitations, risks, security and privacy concerns of performing psychotherapy and the availability of in person appointments. I also discussed with the patient that there may be a patient responsible charge related to this service. The patient expressed understanding and agreed to proceed. I discussed the treatment planning with the patient. The patient was provided an opportunity to ask questions and all were answered. The patient agreed with the plan and demonstrated an understanding of the instructions. The patient was advised to call  our office if  symptoms worsen or feel they are in a crisis state and need immediate contact.   Therapist Location: office Patient Location: home    Treatment Type: Individual Therapy  Reported Symptoms: anxiety, sleep issues-excessive currently was not sleeping enough, depression,triggered responses, overwhelmed, focusing issues, difficulty completing tasks  Mental Status Exam:  Appearance:   Casual     Behavior:  Appropriate  Motor:  Normal  Speech/Language:   Normal Rate  Affect:  Appropriate  Mood:  normal  Thought process:  normal  Thought content:    WNL  Sensory/Perceptual disturbances:    WNL  Orientation:  oriented to person, place, time/date, and situation  Attention:  Good  Concentration:  Good  Memory:  WNL  Fund of knowledge:   Good  Insight:    Good  Judgment:   Good  Impulse Control:  Good   Risk Assessment: Danger to Self:  No Self-injurious Behavior: No Danger to Others: No Duty to Warn:no Physical Aggression /  Violence:No  Access to Firearms a concern: No  Gang Involvement:No   Subjective: Met with patient via virtual session.  He shared that it has been a journey over the last month.  He stated he has done some thinking about his life and he decided that he needed to take some time away from work for his mental health. He stated he feels some better now and he mowed his yard after not mowing it for 4 months.  He has a 2 month leave to try and get things taken care of for himself. He still recognizes that he has a lot to do to be able to return to work at that time. He shared that he realized the 7 days a week 12 hours a day is too much. Discussed how it is impossible to sustain that work level.  He shared that his work is crisis based and that can get completely overwhelming. Developed treatment plan and set goals with patient could not sign due to session being virtual.  Encouraged patient to use this time to start developing self care habits he can do through the day and to implement a schedule that he can use now for when he returns to work.  He is to talk with wife and develop a priority list of things to accomplish that will help him feel calmer when he returns to work as well.  Interventions: Solution-Oriented/Positive Psychology  Diagnosis:   ICD-10-CM   1. GAD (generalized anxiety disorder)  F41.1       Plan: Patient is to start working on creating times throughout the day for  self-care.  Patient is to make a list of projects with his wife to make sure accomplished that will help him feel calmer when he returns to work.  Patient is to take medications as directed Long-term goal: Resolve the core conflict that is the source of anxiety.  Utilize EMDR and brain spotting to help change automatic negative cognitions. Short-term goal: Identify the major life complex in the past and present in the form the basis for present anxiety.  Develop assertiveness skills  Lina Sayre,  [facility]

## 2021-02-19 ENCOUNTER — Ambulatory Visit (INDEPENDENT_AMBULATORY_CARE_PROVIDER_SITE_OTHER): Payer: 59 | Admitting: Psychiatry

## 2021-02-19 DIAGNOSIS — F411 Generalized anxiety disorder: Secondary | ICD-10-CM | POA: Diagnosis not present

## 2021-02-19 NOTE — Progress Notes (Signed)
[facility] Counselor/Therapist Progress Note  Patient ID: Devin Walker, MRN: [id number],    Date: 02/19/2021  Time Spent: 65 minutes 1:08 PM end time 2:13 PM Virtual Visit via Video Note Connected with patient by a telemedicine/telehealth application, with their informed consent, and verified patient privacy and that I am speaking with the correct person using two identifiers. I discussed the limitations, risks, security and privacy concerns of performing psychotherapy and the availability of in person appointments. I also discussed with the patient that there may be a patient responsible charge related to this service. The patient expressed understanding and agreed to proceed. I discussed the treatment planning with the patient. The patient was provided an opportunity to ask questions and all were answered. The patient agreed with the plan and demonstrated an understanding of the instructions. The patient was advised to call  our office if  symptoms worsen or feel they are in a crisis state and need immediate contact.   Therapist Location: office Patient Location: home    Treatment Type: Individual Therapy  Reported Symptoms: anxiety, depression, sleep issues, intrusive thoughts, rumination,hyper focusing issues  Mental Status Exam:  Appearance:   Casual and Neat     Behavior:  Appropriate  Motor:  Normal  Speech/Language:   Normal Rate  Affect:  Appropriate  Mood:  anxious  Thought process:  normal  Thought content:    WNL  Sensory/Perceptual disturbances:    WNL  Orientation:  oriented to person, place, time/date, and situation  Attention:  Good  Concentration:  Good  Memory:  WNL  Fund of knowledge:   Good  Insight:    Good  Judgment:   Good  Impulse Control:  Good   Risk Assessment: Danger to Self:  No Self-injurious Behavior: No Danger to Others: No Duty to Warn:no Physical Aggression / Violence:No  Access to Firearms a concern: No  Gang Involvement:No    Subjective: Met with patient via virtual session.  He shared he is feeling better since last session. He reported that he had followed through on plans from last session and found it helpful.  Discussed ways to continue and tweak plans to be more effective.  Patient stated he is still very anxious about his work situation.  Had patient discuss what he felt created the most issues for him.  He was able to recognize that having multiple projects at the same time and constantly being interrupted by different emails gets very overwhelming.  Patient did processing on that issue negative cognition "I am not up to it" felt overwhelmed sadness and anxiety in his stomach, suds level 9, patient was able to reduce suds level to 5.  He was able to make some connections that were helpful to him.  Discussed the importance of continuing to work on the issue before trying to return to use work.  Patient acknowledged he does not want to get to work get overwhelmed again and have to be out so before returning he wants to process through more of the unhealthy work dynamic so he can feel prepared.  Interventions: Solution-Oriented/Positive Psychology, Insight-Oriented, and BSP  Diagnosis:   ICD-10-CM   1. GAD (generalized anxiety disorder)  F41.1       Plan: Patient is to use CBT and coping skills to decrease anxiety symptoms.  Patient is to continue exercising regularly to release negative emotions appropriately.  Patient is to work on more self-care techniques.  Patient is to take medication as directed. Long-term goal: Resolve  the core conflict that is the source of anxiety.  Utilize EMDR and brain spotting to help change automatic negative cognitions. Short-term goal: Identify the major life complex in the past and present in the form the basis for present anxiety.  Develop assertiveness skills  Lina Sayre, [facility]

## 2021-03-05 ENCOUNTER — Ambulatory Visit (INDEPENDENT_AMBULATORY_CARE_PROVIDER_SITE_OTHER): Payer: 59 | Admitting: Psychiatry

## 2021-03-05 DIAGNOSIS — F411 Generalized anxiety disorder: Secondary | ICD-10-CM | POA: Diagnosis not present

## 2021-03-05 NOTE — Progress Notes (Signed)
[facility] Counselor/Therapist Progress Note  Patient ID: Devin Walker, MRN: [id number],    Date: 03/05/2021  Time Spent: 48 minutes start time 1:12PM end time 2 PM Virtual Visit via Video Note Connected with patient by a telemedicine/telehealth application, with their informed consent, and verified patient privacy and that I am speaking with the correct person using two identifiers. I discussed the limitations, risks, security and privacy concerns of performing psychotherapy and the availability of in person appointments. I also discussed with the patient that there may be a patient responsible charge related to this service. The patient expressed understanding and agreed to proceed. I discussed the treatment planning with the patient. The patient was provided an opportunity to ask questions and all were answered. The patient agreed with the plan and demonstrated an understanding of the instructions. The patient was advised to call  our office if  symptoms worsen or feel they are in a crisis state and need immediate contact.   Therapist Location: office Patient Location: home    Treatment Type: Individual Therapy  Reported Symptoms: anxiety, sadness, fatigue, low motivation, panic, isolating, guilt, rumination  Mental Status Exam:  Appearance:   Casual and Neat     Behavior:  Appropriate  Motor:  Normal  Speech/Language:   Normal Rate  Affect:  Appropriate  Mood:  anxious and sad  Thought process:  normal  Thought content:    WNL  Sensory/Perceptual disturbances:    WNL  Orientation:  oriented to person, place, time/date, and situation  Attention:  Good  Concentration:  Good  Memory:  WNL  Fund of knowledge:   Good  Insight:    Good  Judgment:   Good  Impulse Control:  Good   Risk Assessment: Danger to Self:  No Self-injurious Behavior: No Danger to Others: No Duty to Warn:no Physical Aggression / Violence:No  Access to Firearms a concern: No  Gang Involvement:No    Subjective: Met with patient via virtual session.  He shared that he has been struggling recently with an increase in anxiety. He shared he has noticed that he has been wanting to isolate.  He shared that he is noticing that he is starting to have issues with noise again and it is impacting his ability to focus.  Patient did processing set on his guilt over isolating himself, suds level 7, negative cognition "I should be better" felt anger or upset guilt and sadness in his stomach and throat.  Patient was able to reduce suds level to 4.  He was able to recognize that there are things he can do to help him manage all of the things going on in his household with 4 children appropriately.  Also discussed the importance of him taking time for himself when he is not at a good place so he can be the person that he wants to be with his kids and wife.  Patient was able to develop some plans that he felt positive about.  Interventions: Cognitive Behavioral Therapy, Solution-Oriented/Positive Psychology, Insight-Oriented, and BS P  Diagnosis:   ICD-10-CM   1. GAD (generalized anxiety disorder)  F41.1       Plan: Patient is to use CBT and coping skills to decrease anxiety symptoms.  Patient is to work on his perspective to help remind him of the things he needs to focus on.  Patient is to exercise to release negative emotions appropriately.  Patient is to work on spending periods of one-on-one time with his different children.  Patient is to take medication as directed. Long-term goal: Resolve the core conflict that is the source of anxiety.  Utilize EMDR and brain spotting to help change automatic negative cognitions. Short-term goal: Identify the major life complex in the past and present in the form the basis for present anxiety.  Develop assertiveness skills    Lina Sayre, [facility]

## 2021-03-13 ENCOUNTER — Ambulatory Visit: Payer: 59 | Admitting: Adult Health

## 2021-03-13 NOTE — Progress Notes (Signed)
Patient no show appointment. ? ?

## 2021-03-19 ENCOUNTER — Telehealth: Payer: Self-pay | Admitting: Adult Health

## 2021-03-19 NOTE — Telephone Encounter (Signed)
received

## 2021-03-19 NOTE — Telephone Encounter (Signed)
Received fax from [facility] regarding [employer]. Completion needed for FMLA form. Placed in Traci's box.

## 2021-03-20 ENCOUNTER — Encounter: Payer: Self-pay | Admitting: Adult Health

## 2021-03-20 ENCOUNTER — Other Ambulatory Visit: Payer: Self-pay

## 2021-03-20 ENCOUNTER — Ambulatory Visit (INDEPENDENT_AMBULATORY_CARE_PROVIDER_SITE_OTHER): Payer: 59 | Admitting: Adult Health

## 2021-03-20 DIAGNOSIS — F909 Attention-deficit hyperactivity disorder, unspecified type: Secondary | ICD-10-CM

## 2021-03-20 DIAGNOSIS — G4700 Insomnia, unspecified: Secondary | ICD-10-CM

## 2021-03-20 DIAGNOSIS — F331 Major depressive disorder, recurrent, moderate: Secondary | ICD-10-CM | POA: Diagnosis not present

## 2021-03-20 DIAGNOSIS — F411 Generalized anxiety disorder: Secondary | ICD-10-CM

## 2021-03-20 MED ORDER — BUSPIRONE HCL 15 MG PO TABS: 15.0000 mg | ORAL_TABLET | Freq: Three times a day (TID) | ORAL | 2 refills | Status: DC

## 2021-03-20 NOTE — Progress Notes (Signed)
Devin Walker [id number] 09-20-1990 30 y.o.  Subjective:   Patient ID:  Devin Walker is a 30 y.o. (DOB 1990/07/13) male.  Chief Complaint: No chief complaint on file.   HPI Devin Walker presents to the office today for follow-up of GAD, MDD and ADHD.  Describes mood today as "not any better". Pleasant. Flat. Denies tearfulness - "I have a hard time crying". Mood symptoms - reports depression, anxiety, and irritability. Feels more anxious overall. Feels very short tempered - "my switch is easily flipped". Gets agitated very easily. Having a lot of racing thoughts. Gets overwhelmed very easily. Stating "I feel kind of confused and lost - and I don't know what to do". Feels panicky - having a hard time feeling comfortable in his own skin. Stating "I'm on the edge constantly and I have a hard time relaxing". Has started therapy and has attended 4 sessions. Trying to work on more positive coping mechanisms. Stating "I'm working on myself". Trying to be more mindful and take care of himself. Does not feel like he is ready to return to work. Decreased interest and motivation. Taking medications a prescribed. Plans to see therapist. Energy levels decreased - very low". Active, does not have a regular exercise routine.  Enjoys some usual interests and activities. Married. Lives with wife and 4 children. Mother local and supportive.  Appetite adequate. Weight stable - 145 pounds. Sleeps well most nights. Averages 4 hours. Feels "wired". Stating "my brain won't shut off" Focus and concentration stable. Completing tasks. Managing aspects of household. Works full time - on leave currently. Denies SI or HI. Having "creeping thoughts about his self worth". Reports some passive thoughts of self harm. Reports a previous suicide attempt with hospitalization. Denies AH or VH.    Review of Systems:  Review of Systems  Musculoskeletal:  Negative for gait problem.  Neurological:  Negative for tremors.   Psychiatric/Behavioral:         Please refer to HPI   Medications: I have reviewed the patient's current medications.  Current Outpatient Medications  Medication Sig Dispense Refill   amphetamine-dextroamphetamine (ADDERALL XR) 15 MG 24 hr capsule Take one capsule twice daily. 60 capsule 0   amphetamine-dextroamphetamine (ADDERALL XR) 15 MG 24 hr capsule Take 1 capsule by mouth 2 (two) times daily. 60 capsule 0   amphetamine-dextroamphetamine (ADDERALL XR) 15 MG 24 hr capsule Take 1 capsule by mouth 2 (two) times daily. 60 capsule 0   busPIRone (BUSPAR) 15 MG tablet Take 1 tablet (15 mg total) by mouth 3 (three) times daily. 90 tablet 2   escitalopram (LEXAPRO) 20 MG tablet Take 1 tablet (20 mg total) by mouth daily. 30 tablet 5   No current facility-administered medications for this visit.    Medication Side Effects: None  Allergies: No Known Allergies  No past medical history on file.  Past Medical History, Surgical history, Social history, and Family history were reviewed and updated as appropriate.   Please see review of systems for further details on the patient's review from today.   Objective:   Physical Exam:  There were no vitals taken for this visit.  Physical Exam Constitutional:      General: He is not in acute distress. Musculoskeletal:        General: No deformity.  Neurological:     Mental Status: He is alert and oriented to person, place, and time.     Coordination: Coordination normal.  Psychiatric:        Attention and Perception:  Attention and perception normal. He does not perceive auditory or visual hallucinations.        Mood and Affect: Mood normal. Mood is not anxious or depressed. Affect is not labile, blunt, angry or inappropriate.        Speech: Speech normal.        Behavior: Behavior normal.        Thought Content: Thought content normal. Thought content is not paranoid or delusional. Thought content does not include homicidal or suicidal  ideation. Thought content does not include homicidal or suicidal plan.        Cognition and Memory: Cognition and memory normal.        Judgment: Judgment normal.     Comments: Insight intact    Lab Review:  No results found for: NA, K, CL, CO2, GLUCOSE, BUN, CREATININE, CALCIUM, PROT, ALBUMIN, AST, ALT, ALKPHOS, BILITOT, GFRNONAA, GFRAA  No results found for: WBC, RBC, HGB, HCT, PLT, MCV, MCH, MCHC, RDW, LYMPHSABS, MONOABS, EOSABS, BASOSABS  No results found for: POCLITH, LITHIUM   No results found for: PHENYTOIN, PHENOBARB, VALPROATE, CBMZ   .res Assessment: Plan:    Plan:  1. Lexapro 20mg  daily 2. Adderall XR 15mg  BID  3. Buspar 10mg  to 15 TID  RTC 2 months   Time spent with patient was 30 minutes. Greater than 50% of face to face time with patient was spent on counseling and coordination of care.   Patient totally disabled and unable to work. Patient out of work from 03/13/2021 through 05/13/2020. Will reassess a return to work date at next visit.   Patient advised to contact office with any questions, adverse effects, or acute worsening in signs and symptoms.  Discussed potential benefits, risks, and side effects of stimulants with patient to include increased heart rate, palpitations, insomnia, increased anxiety, increased irritability, or decreased appetite. Instructed patient to contact office if experiencing any significant tolerability issues. Diagnoses and all orders for this visit:  Generalized anxiety disorder -     busPIRone (BUSPAR) 15 MG tablet; Take 1 tablet (15 mg total) by mouth 3 (three) times daily.  Major depressive disorder, recurrent episode, moderate (HCC)  Attention deficit hyperactivity disorder (ADHD), unspecified ADHD type  Insomnia, unspecified type    Please see After Visit Summary for patient specific instructions.  No future appointments.  No orders of the defined types were placed in this  encounter.   -------------------------------

## 2021-03-26 DIAGNOSIS — Z0289 Encounter for other administrative examinations: Secondary | ICD-10-CM

## 2021-03-27 NOTE — Telephone Encounter (Signed)
Paper work completed and signed by Powell on 03/26/21. Given to front office staff to fax records as well.

## 2021-04-17 ENCOUNTER — Ambulatory Visit: Payer: 59 | Admitting: Adult Health

## 2021-04-17 ENCOUNTER — Telehealth: Payer: Self-pay | Admitting: Adult Health

## 2021-04-17 NOTE — Telephone Encounter (Signed)
Received Extension of STD Form. Placed in Traci's  box 12/20

## 2021-04-17 NOTE — Progress Notes (Signed)
Patient no show appointment. ? ?

## 2021-04-26 NOTE — Telephone Encounter (Signed)
Form completed and signed. Pt also needs to set up his next f/u

## 2021-04-26 NOTE — Telephone Encounter (Signed)
Called pt to schedule follow-up VM full

## 2021-05-01 ENCOUNTER — Encounter: Payer: Self-pay | Admitting: Adult Health

## 2021-05-01 ENCOUNTER — Ambulatory Visit (INDEPENDENT_AMBULATORY_CARE_PROVIDER_SITE_OTHER): Payer: 59 | Admitting: Adult Health

## 2021-05-01 ENCOUNTER — Other Ambulatory Visit: Payer: Self-pay

## 2021-05-01 DIAGNOSIS — G4700 Insomnia, unspecified: Secondary | ICD-10-CM | POA: Diagnosis not present

## 2021-05-01 DIAGNOSIS — F909 Attention-deficit hyperactivity disorder, unspecified type: Secondary | ICD-10-CM | POA: Diagnosis not present

## 2021-05-01 DIAGNOSIS — F411 Generalized anxiety disorder: Secondary | ICD-10-CM | POA: Diagnosis not present

## 2021-05-01 DIAGNOSIS — F331 Major depressive disorder, recurrent, moderate: Secondary | ICD-10-CM

## 2021-05-01 NOTE — Progress Notes (Signed)
Vegas Tringali [id number] June 28, 1990 31 y.o.  Subjective:   Patient ID:  Devin Walker is a 31 y.o. (DOB [birth year]) male.  Chief Complaint: No chief complaint on file.   HPI Devin Walker presents to the office today for follow-up of GAD, MDD, insomnia and ADHD.  Describes mood today as "worse". Pleasant. Flat. Denies tearfulness. Feels like he is spiraling. Mood symptoms - reports depression, anxiety, and irritability. Stating "I can't cry or laugh - I feel kinda empty". Unable to find humor or joy - feels very "apathetic". Has a short fuse - impatient. Gets agitated easily. Having a lot of racing thoughts. Feels very critical of himself and is overanalyzing himself. Dealing with self guilt - not engaging with family. Started therapy and has attended 5 sessions and is unable to get another appointment until March. Has been calling around and hopes to find a therapist he can work with weekly. Decreased interest and motivation. Taking medications a prescribed. Does not feel like he is ready to return to work - "I can't do it". Energy levels decreased - "feels tired all the time - lethargic". Active, does not have a regular exercise routine.  Enjoys some usual interests and activities. Married. Lives with wife and 4 children. Mother local and supportive.  Appetite adequate - making himself eat. Weight stable - 145 pounds. Sleeping difficulties - "it comes and goes in waves". Having dark dreams and nightmares. Averages 5 hours after going 3 to 4 days and then gets 12 hours of sleep.  Focus and concentration difficulties. Completing tasks. Managing minimal aspects of household - "pushing myself". Works full time - on leave currently. Denies SI or HI.  Denies AH or VH.   Review of Systems:  Review of Systems  Musculoskeletal:  Negative for gait problem.  Neurological:  Negative for tremors.  Psychiatric/Behavioral:         Please refer to HPI   Medications: I have reviewed the patient's current  medications.  Current Outpatient Medications  Medication Sig Dispense Refill   amphetamine-dextroamphetamine (ADDERALL XR) 15 MG 24 hr capsule Take one capsule twice daily. 60 capsule 0   amphetamine-dextroamphetamine (ADDERALL XR) 15 MG 24 hr capsule Take 1 capsule by mouth 2 (two) times daily. 60 capsule 0   amphetamine-dextroamphetamine (ADDERALL XR) 15 MG 24 hr capsule Take 1 capsule by mouth 2 (two) times daily. 60 capsule 0   busPIRone (BUSPAR) 15 MG tablet Take 1 tablet (15 mg total) by mouth 3 (three) times daily. 90 tablet 2   escitalopram (LEXAPRO) 20 MG tablet Take 1 tablet (20 mg total) by mouth daily. 30 tablet 5   No current facility-administered medications for this visit.    Medication Side Effects: None  Allergies: No Known Allergies  No past medical history on file.  Past Medical History, Surgical history, Social history, and Family history were reviewed and updated as appropriate.   Please see review of systems for further details on the patient's review from today.   Objective:   Physical Exam:  There were no vitals taken for this visit.  Physical Exam Constitutional:      General: He is not in acute distress. Musculoskeletal:        General: No deformity.  Neurological:     Mental Status: He is alert and oriented to person, place, and time.     Coordination: Coordination normal.  Psychiatric:        Attention and Perception: Attention and perception normal. He does not perceive auditory or  visual hallucinations.        Mood and Affect: Mood normal. Mood is not anxious or depressed. Affect is not labile, blunt, angry or inappropriate.        Speech: Speech normal.        Behavior: Behavior normal.        Thought Content: Thought content normal. Thought content is not paranoid or delusional. Thought content does not include homicidal or suicidal ideation. Thought content does not include homicidal or suicidal plan.        Cognition and Memory: Cognition  and memory normal.        Judgment: Judgment normal.     Comments: Insight intact    Lab Review:  No results found for: NA, K, CL, CO2, GLUCOSE, BUN, CREATININE, CALCIUM, PROT, ALBUMIN, AST, ALT, ALKPHOS, BILITOT, GFRNONAA, GFRAA  No results found for: WBC, RBC, HGB, HCT, PLT, MCV, MCH, MCHC, RDW, LYMPHSABS, MONOABS, EOSABS, BASOSABS  No results found for: POCLITH, LITHIUM   No results found for: PHENYTOIN, PHENOBARB, VALPROATE, CBMZ   .res Assessment: Plan:    Plan:  1. Lexapro 20mg  daily 2. Adderall XR 15mg  BID  3. Buspar 10mg  to 15 TID 4. Add Trazadone 50mg  at hs.  Plans to see a new therapist - unable to get regular appointments.  RTC 2 months  Time spent with patient was 30 minutes. Greater than 50% of face to face time with patient was spent on counseling and coordination of care.   Patient totally disabled and unable to work. Patient out of work from 03/13/2021 through 05/13/2021. Will reassess a return to work date at next appointment. Patient will need an extension beyond his return to work date.   Patient advised to contact office with any questions, adverse effects, or acute worsening in signs and symptoms.  Discussed potential benefits, risks, and side effects of stimulants with patient to include increased heart rate, palpitations, insomnia, increased anxiety, increased irritability, or decreased appetite. Instructed patient to contact office if experiencing any significant tolerability issues.  There are no diagnoses linked to this encounter.   Please see After Visit Summary for patient specific instructions.  Future Appointments  Date Time Provider [place]  06/28/2021  2:00 PM Lina Sayre, [facility] CP-CP None    No orders of the defined types were placed in this encounter.   -------------------------------

## 2021-05-14 ENCOUNTER — Telehealth: Payer: Self-pay | Admitting: Adult Health

## 2021-05-14 NOTE — Telephone Encounter (Signed)
FYI

## 2021-05-14 NOTE — Telephone Encounter (Signed)
Update

## 2021-05-14 NOTE — Telephone Encounter (Signed)
Pt called and asked if the paperwork referenced in this request had been received.  I advised him it had.  Also he said Barnett Applebaum wanted him to call back and provide info once he had appt with an outside therapist scheduled.  He said he met with Midge Spong on 1/11.  He also stated he has appts scheduled for 1/18, 1/23 and 1/24 and then weekly.  (I confirmed with him the dates of 1/23 and 1/24. He said due to her limited availability, they did schedule two times in one week.)  Next appt 1/31

## 2021-05-14 NOTE — Telephone Encounter (Signed)
Received Concurrent Disability and Leave Statement Form. Placed in Traci's box 1/16

## 2021-05-20 NOTE — Telephone Encounter (Signed)
Forms completed and given to Regina to sign °

## 2021-05-21 DIAGNOSIS — Z0289 Encounter for other administrative examinations: Secondary | ICD-10-CM

## 2021-05-22 ENCOUNTER — Telehealth: Payer: Self-pay | Admitting: Adult Health

## 2021-05-22 NOTE — Telephone Encounter (Signed)
The Concurrent disability/leave statement and records were faxed to [facility].

## 2021-05-29 ENCOUNTER — Ambulatory Visit: Payer: 59 | Admitting: Adult Health

## 2021-05-29 DIAGNOSIS — F489 Nonpsychotic mental disorder, unspecified: Secondary | ICD-10-CM

## 2021-05-29 NOTE — Progress Notes (Signed)
Patient no show appointment. ? ?

## 2021-06-05 ENCOUNTER — Telehealth: Payer: Self-pay | Admitting: Adult Health

## 2021-06-05 NOTE — Telephone Encounter (Signed)
Patient lm inquiring on the status of his paperwork. The message stated he sent paperwork to office on 1/13 which we confirmed receiving. He also states the recipient of the signed documentation haven't received forms from our office as of yet. The deadline is 2/13. Please contact Gladys of the status. [phone].

## 2021-06-05 NOTE — Telephone Encounter (Signed)
Paper work has been submitted on 1/23 and scanned into media it looks like. I can refax today or tomorrow

## 2021-06-05 NOTE — Telephone Encounter (Signed)
Pt stated thjey are sending over another form that needs to be filled out after appt on 2/8 and faxed back by 2/14

## 2021-06-05 NOTE — Telephone Encounter (Signed)
Please review

## 2021-06-06 ENCOUNTER — Ambulatory Visit (INDEPENDENT_AMBULATORY_CARE_PROVIDER_SITE_OTHER): Payer: 59 | Admitting: Adult Health

## 2021-06-06 ENCOUNTER — Other Ambulatory Visit: Payer: Self-pay

## 2021-06-06 ENCOUNTER — Telehealth: Payer: Self-pay | Admitting: Adult Health

## 2021-06-06 ENCOUNTER — Encounter: Payer: Self-pay | Admitting: Adult Health

## 2021-06-06 DIAGNOSIS — F909 Attention-deficit hyperactivity disorder, unspecified type: Secondary | ICD-10-CM

## 2021-06-06 DIAGNOSIS — F411 Generalized anxiety disorder: Secondary | ICD-10-CM | POA: Diagnosis not present

## 2021-06-06 DIAGNOSIS — G4700 Insomnia, unspecified: Secondary | ICD-10-CM | POA: Diagnosis not present

## 2021-06-06 DIAGNOSIS — F331 Major depressive disorder, recurrent, moderate: Secondary | ICD-10-CM

## 2021-06-06 MED ORDER — BUSPIRONE HCL 15 MG PO TABS: 15.0000 mg | ORAL_TABLET | Freq: Three times a day (TID) | ORAL | 2 refills | Status: DC

## 2021-06-06 MED ORDER — ESCITALOPRAM OXALATE 20 MG PO TABS: 20.0000 mg | ORAL_TABLET | Freq: Every day | ORAL | 5 refills | Status: DC

## 2021-06-06 MED ORDER — TRAZODONE HCL 50 MG PO TABS: 50.0000 mg | ORAL_TABLET | Freq: Every day | ORAL | 2 refills | Status: DC

## 2021-06-06 NOTE — Progress Notes (Signed)
Devin Walker [id number] [birth year] 31 y.o.  Subjective:   Patient ID:  Devin Walker is a 31 y.o. (DOB July 18, 1990) male.  Chief Complaint: No chief complaint on file.   HPI Marsh Heckler presents to the office today for follow-up of GAD, MDD, insomnia and ADHD.  Describes mood today as "about the same". Pleasant. Flat. Denies tearfulness - "I can't cry". Mood symptoms - reports depression, anxiety, and irritability - "feels agitated easily". Feels lethargic. Stating "I'm struggling to feel emotions". Still feeling "apathetic". Has been working with a therapist twice a week and feels he is learning some useful strategies. Decreased interest and motivation. Taking medications a prescribed. Does not feel like he is ready to return to work at this time.  Energy levels low - "feels tired a lot of the time - lethargic". Active, does not have a regular exercise routine. Has gotten a stationary bike and is using it some. Enjoys some usual interests and activities. Married. Lives with wife and 4 children. Mother local and supportive.  Appetite adequate - making himself eat. Weight loss 136 from 145 pounds. Sleeping better some nights than others. Having nightmares and can't get back to sleep. Averges 6 hours.  Focus and concentration difficulties. Completing tasks. Managing minimal aspects of household. Works full time - on leave currently. Denies SI or HI.  Denies AH or VH.    Review of Systems:  Review of Systems  Musculoskeletal:  Negative for gait problem.  Neurological:  Negative for tremors.  Psychiatric/Behavioral:         Please refer to HPI   Medications: I have reviewed the patient's current medications.  Current Outpatient Medications  Medication Sig Dispense Refill   traZODone (DESYREL) 50 MG tablet Take 1 tablet (50 mg total) by mouth at bedtime. 30 tablet 2   amphetamine-dextroamphetamine (ADDERALL XR) 15 MG 24 hr capsule Take one capsule twice daily. 60 capsule 0    amphetamine-dextroamphetamine (ADDERALL XR) 15 MG 24 hr capsule Take 1 capsule by mouth 2 (two) times daily. 60 capsule 0   amphetamine-dextroamphetamine (ADDERALL XR) 15 MG 24 hr capsule Take 1 capsule by mouth 2 (two) times daily. 60 capsule 0   busPIRone (BUSPAR) 15 MG tablet Take 1 tablet (15 mg total) by mouth 3 (three) times daily. 90 tablet 2   escitalopram (LEXAPRO) 20 MG tablet Take 1 tablet (20 mg total) by mouth daily. 30 tablet 5   No current facility-administered medications for this visit.    Medication Side Effects: None  Allergies: No Known Allergies  No past medical history on file.  Past Medical History, Surgical history, Social history, and Family history were reviewed and updated as appropriate.   Please see review of systems for further details on the patient's review from today.   Objective:   Physical Exam:  There were no vitals taken for this visit.  Physical Exam Constitutional:      General: He is not in acute distress. Musculoskeletal:        General: No deformity.  Skin:    Findings: No lesion.  Neurological:     Mental Status: He is alert and oriented to person, place, and time.     Coordination: Coordination normal.  Psychiatric:        Attention and Perception: Attention and perception normal. He does not perceive auditory or visual hallucinations.        Mood and Affect: Mood normal. Mood is not anxious or depressed. Affect is not labile, blunt, angry or inappropriate.  Speech: Speech normal.        Behavior: Behavior normal.        Thought Content: Thought content normal. Thought content is not paranoid or delusional. Thought content does not include homicidal or suicidal ideation. Thought content does not include homicidal or suicidal plan.        Cognition and Memory: Cognition and memory normal.        Judgment: Judgment normal.     Comments: Insight intact    Lab Review:  No results found for: NA, K, CL, CO2, GLUCOSE, BUN,  CREATININE, CALCIUM, PROT, ALBUMIN, AST, ALT, ALKPHOS, BILITOT, GFRNONAA, GFRAA  No results found for: WBC, RBC, HGB, HCT, PLT, MCV, MCH, MCHC, RDW, LYMPHSABS, MONOABS, EOSABS, BASOSABS  No results found for: POCLITH, LITHIUM   No results found for: PHENYTOIN, PHENOBARB, VALPROATE, CBMZ   .res Assessment: Plan:    Plan:  1. Lexapro 20mg  daily 2. Adderall XR 15mg  BID  3. Buspar 10mg  to 15 TID 4. Trazadone 50mg  at hs.  Working with a therapist  RTC 2 months  Time spent with patient was 30 minutes. Greater than 50% of face to face time with patient was spent on counseling and coordination of care.   Patient totally disabled and unable to work. Patient out of work from 03/13/2021 through 07/09/2021. Will reassess a return to work date at next appointment.  Patient advised to contact office with any questions, adverse effects, or acute worsening in signs and symptoms.  Discussed potential benefits, risks, and side effects of stimulants with patient to include increased heart rate, palpitations, insomnia, increased anxiety, increased irritability, or decreased appetite. Instructed patient to contact office if experiencing any significant tolerability issues.   Diagnoses and all orders for this visit:  Insomnia, unspecified type -     traZODone (DESYREL) 50 MG tablet; Take 1 tablet (50 mg total) by mouth at bedtime.  Generalized anxiety disorder -     busPIRone (BUSPAR) 15 MG tablet; Take 1 tablet (15 mg total) by mouth 3 (three) times daily. -     escitalopram (LEXAPRO) 20 MG tablet; Take 1 tablet (20 mg total) by mouth daily.  Major depressive disorder, recurrent episode, moderate (HCC) -     escitalopram (LEXAPRO) 20 MG tablet; Take 1 tablet (20 mg total) by mouth daily.  Attention deficit hyperactivity disorder (ADHD), unspecified ADHD type     Please see After Visit Summary for patient specific instructions.  Future Appointments  Date Time Provider Department Center   06/28/2021  2:00 PM , [facility] CP-CP None  07/06/2021  1:40 PM Aoki Wedemeyer, 08/28/2021, NP CP-CP None    No orders of the defined types were placed in this encounter.   -------------------------------

## 2021-06-11 NOTE — Telephone Encounter (Signed)
Form completed and given to [facility] to review and sign

## 2021-06-15 ENCOUNTER — Other Ambulatory Visit: Payer: Self-pay | Admitting: Adult Health

## 2021-06-15 MED ORDER — LISDEXAMFETAMINE DIMESYLATE 30 MG PO CAPS: 30.0000 mg | ORAL_CAPSULE | Freq: Every day | ORAL | 0 refills | Status: DC

## 2021-06-15 NOTE — Telephone Encounter (Signed)
Please review

## 2021-06-15 NOTE — Telephone Encounter (Signed)
Devin Walker said that he was given Vyvanse for a trial and it worked well for him. Can you prescribe this for him? His number is [phone]

## 2021-06-15 NOTE — Telephone Encounter (Signed)
Ok to pend.

## 2021-06-28 ENCOUNTER — Ambulatory Visit: Payer: 59 | Admitting: Psychiatry

## 2021-07-06 ENCOUNTER — Encounter: Payer: Self-pay | Admitting: Adult Health

## 2021-07-06 ENCOUNTER — Ambulatory Visit (INDEPENDENT_AMBULATORY_CARE_PROVIDER_SITE_OTHER): Payer: 59 | Admitting: Adult Health

## 2021-07-06 ENCOUNTER — Other Ambulatory Visit: Payer: Self-pay

## 2021-07-06 DIAGNOSIS — F331 Major depressive disorder, recurrent, moderate: Secondary | ICD-10-CM | POA: Diagnosis not present

## 2021-07-06 DIAGNOSIS — F411 Generalized anxiety disorder: Secondary | ICD-10-CM | POA: Diagnosis not present

## 2021-07-06 DIAGNOSIS — F909 Attention-deficit hyperactivity disorder, unspecified type: Secondary | ICD-10-CM

## 2021-07-06 DIAGNOSIS — G4700 Insomnia, unspecified: Secondary | ICD-10-CM

## 2021-07-06 MED ORDER — LISDEXAMFETAMINE DIMESYLATE 40 MG PO CAPS: 40.0000 mg | ORAL_CAPSULE | Freq: Every day | ORAL | 0 refills | Status: DC

## 2021-07-06 NOTE — Progress Notes (Signed)
Daequan Ripple ?[id number] ?[birth year] ?31 y.o. ? ?Subjective:  ? ?Patient ID:  Devin Walker is a 31 y.o. (DOB 07-Feb-1991) male. ? ?Chief Complaint: No chief complaint on file. ? ? ?HPI ?Devin Walker presents to the office today for follow-up of GAD, MDD, insomnia and ADHD. ? ?Describes mood today as "better". Pleasant. Denies tearfulness. Mood symptoms - reports decreased depression, anxiety, and irritability. Stating "I feel better". Has been working with therapist and feels he is ready to return to work - July 09, 2021. Family doing well. Improved interest and motivation. Taking medications a prescribed.  ?Energy levels improved. Active, does not have a regular exercise routine.   ?Enjoys some usual interests and activities. Married. Lives with wife and 4 children. Mother local and supportive.  ?Appetite adequate - making himself eat. Weight loss 136 from 145 pounds. ?Sleeping well most nights. Averges 8 hours.  ?Focus and concentration difficulties - improved with Vyvanse 30mg  - would like to increase to 40mg  daily. Completing tasks. Managing minimal aspects of household. Works full time - returning 07-09-2021. ?Denies SI or HI.  ?Denies AH or VH.  ? ? ? ?Review of Systems:  ?Review of Systems  ?Musculoskeletal:  Negative for gait problem.  ?Neurological:  Negative for tremors.  ?Psychiatric/Behavioral:    ?     Please refer to HPI  ? ?Medications: I have reviewed the patient's current medications. ? ?Current Outpatient Medications  ?Medication Sig Dispense Refill  ? busPIRone (BUSPAR) 15 MG tablet Take 1 tablet (15 mg total) by mouth 3 (three) times daily. 90 tablet 2  ? escitalopram (LEXAPRO) 20 MG tablet Take 1 tablet (20 mg total) by mouth daily. 30 tablet 5  ? lisdexamfetamine (VYVANSE) 40 MG capsule Take 1 capsule (40 mg total) by mouth daily. 30 capsule 0  ? traZODone (DESYREL) 50 MG tablet Take 1 tablet (50 mg total) by mouth at bedtime. 30 tablet 2  ? ?No current facility-administered medications for this  visit.  ? ? ?Medication Side Effects: None ? ?Allergies: No Known Allergies ? ?No past medical history on file. ? ?Past Medical History, Surgical history, Social history, and Family history were reviewed and updated as appropriate.  ? ?Please see review of systems for further details on the patient's review from today.  ? ?Objective:  ? ?Physical Exam:  ?There were no vitals taken for this visit. ? ?Physical Exam ?Constitutional:   ?   General: He is not in acute distress. ?Musculoskeletal:     ?   General: No deformity.  ?Neurological:  ?   Mental Status: He is alert and oriented to person, place, and time.  ?   Coordination: Coordination normal.  ?Psychiatric:     ?   Attention and Perception: Attention and perception normal. He does not perceive auditory or visual hallucinations.     ?   Mood and Affect: Mood normal. Mood is not anxious or depressed. Affect is not labile, blunt, angry or inappropriate.     ?   Speech: Speech normal.     ?   Behavior: Behavior normal.     ?   Thought Content: Thought content normal. Thought content is not paranoid or delusional. Thought content does not include homicidal or suicidal ideation. Thought content does not include homicidal or suicidal plan.     ?   Cognition and Memory: Cognition and memory normal.     ?   Judgment: Judgment normal.  ?   Comments: Insight intact  ? ? ?Lab Review:  ?  No results found for: NA, K, CL, CO2, GLUCOSE, BUN, CREATININE, CALCIUM, PROT, ALBUMIN, AST, ALT, ALKPHOS, BILITOT, GFRNONAA, GFRAA ? ?No results found for: WBC, RBC, HGB, HCT, PLT, MCV, MCH, MCHC, RDW, LYMPHSABS, MONOABS, EOSABS, BASOSABS ? ?No results found for: POCLITH, LITHIUM  ? ?No results found for: PHENYTOIN, PHENOBARB, VALPROATE, CBMZ  ? ?.res ?Assessment: Plan:   ? ?Plan: ? ?1. Lexapro 20mg  daily ?2. Vyvanse 30mg  daily ?3. Buspar 15 TID ? ?Working with a therapist ? ?109/64/88 ? ?RTC 3 months ? ?Time spent with patient was 30 minutes. Greater than 50% of face to face time with  patient was spent on counseling and coordination of care.  ? ?Patient totally disabled and unable to work. Patient out of work from 03/13/2021 through 07/09/2021. Will reassess a return to work date at next appointment. ? ?Patient advised to contact office with any questions, adverse effects, or acute worsening in signs and symptoms. ? ?Discussed potential benefits, risks, and side effects of stimulants with patient to include increased heart rate, palpitations, insomnia, increased anxiety, increased irritability, or decreased appetite. Instructed patient to contact office if experiencing any significant tolerability issues. ? ?Diagnoses and all orders for this visit: ? ?Attention deficit hyperactivity disorder (ADHD), unspecified ADHD type ?-     lisdexamfetamine (VYVANSE) 40 MG capsule; Take 1 capsule (40 mg total) by mouth daily. ? ?Major depressive disorder, recurrent episode, moderate ([place]) ? ?Insomnia, unspecified type ? ?Generalized anxiety disorder ? ?  ? ?Please see After Visit Summary for patient specific instructions. ? ?No future appointments. ? ?No orders of the defined types were placed in this encounter. ? ? ?------------------------------- ?

## 2021-08-07 ENCOUNTER — Other Ambulatory Visit: Payer: Self-pay | Admitting: Adult Health

## 2021-08-07 ENCOUNTER — Telehealth: Payer: Self-pay | Admitting: Adult Health

## 2021-08-07 DIAGNOSIS — F909 Attention-deficit hyperactivity disorder, unspecified type: Secondary | ICD-10-CM

## 2021-08-07 MED ORDER — LISDEXAMFETAMINE DIMESYLATE 40 MG PO CAPS: 40.0000 mg | ORAL_CAPSULE | Freq: Every day | ORAL | 0 refills | Status: DC

## 2021-08-07 NOTE — Telephone Encounter (Signed)
Script sent  

## 2021-08-07 NOTE — Telephone Encounter (Signed)
Patient lvm requesting a refill on the Vyvanse 40 mg capsules. Patient was last seen in the office on 3/10 with no follow scheduled. ?

## 2021-09-11 ENCOUNTER — Other Ambulatory Visit: Payer: Self-pay

## 2021-09-11 ENCOUNTER — Telehealth: Payer: Self-pay | Admitting: Adult Health

## 2021-09-11 DIAGNOSIS — F909 Attention-deficit hyperactivity disorder, unspecified type: Secondary | ICD-10-CM

## 2021-09-11 MED ORDER — LISDEXAMFETAMINE DIMESYLATE 40 MG PO CAPS: 40.0000 mg | ORAL_CAPSULE | Freq: Every day | ORAL | 0 refills | Status: DC

## 2021-09-11 NOTE — Telephone Encounter (Signed)
Vyvanse was increased from 30 mg to 40 mg in March. Last RF 4/13.  ?

## 2021-09-11 NOTE — Telephone Encounter (Signed)
Pended.

## 2021-09-11 NOTE — Telephone Encounter (Signed)
Next visit is 10/10/21. Manjot is requesting a refill on his Vyvanse 40 mg called to: ? ?[facility] [id number] - [place], [place] - [address] ? ?Phone:  [phone]  ?Fax:  [phone]  ? ? ? ? ? ?

## 2021-09-11 NOTE — Telephone Encounter (Signed)
LM tcb for an appt. Devin Walker called and asked if he can increase his Vyvanse? He feels like it is not helping him enough now. His phone number is [phone]. Pharmacy is: ? ?[facility] [id number] - [place], [place] - [address] ? ?Phone:  [phone]  ?Fax:  [phone]  ? ? ? ?

## 2021-09-13 NOTE — Telephone Encounter (Signed)
Mailbox is full. Wanted to know why patient felt he needed an increase in dose.

## 2021-09-14 NOTE — Telephone Encounter (Signed)
Attempted to call patient again and mailbox is full.

## 2021-09-17 NOTE — Telephone Encounter (Signed)
Noted. Ty!

## 2021-09-17 NOTE — Telephone Encounter (Signed)
Patient called asking to increase his Vyvanse dose. I have called 3 times, but have been unable to reach patient and his mailbox is full. I was going to ask patient why he felt he needed to increase the dose. He had just increased from 30-40 mg in March. He has an appt 6/14.

## 2021-09-17 NOTE — Telephone Encounter (Signed)
Mailbox is full.

## 2021-10-10 ENCOUNTER — Ambulatory Visit (INDEPENDENT_AMBULATORY_CARE_PROVIDER_SITE_OTHER): Payer: Self-pay | Admitting: Adult Health

## 2021-10-10 ENCOUNTER — Telehealth: Payer: Self-pay | Admitting: Adult Health

## 2021-10-10 ENCOUNTER — Other Ambulatory Visit: Payer: Self-pay

## 2021-10-10 DIAGNOSIS — F909 Attention-deficit hyperactivity disorder, unspecified type: Secondary | ICD-10-CM

## 2021-10-10 DIAGNOSIS — Z91199 Patient's noncompliance with other medical treatment and regimen due to unspecified reason: Secondary | ICD-10-CM

## 2021-10-10 MED ORDER — LISDEXAMFETAMINE DIMESYLATE 40 MG PO CAPS: 40.0000 mg | ORAL_CAPSULE | Freq: Every day | ORAL | 0 refills | Status: DC

## 2021-10-10 NOTE — Telephone Encounter (Signed)
Pended.

## 2021-10-10 NOTE — Progress Notes (Signed)
Patient no show appointment. ? ?

## 2021-10-10 NOTE — Telephone Encounter (Signed)
Patient lvm today at 4:37 requesting a refill on the Vyvanse. Fill at the [place] location. Patient has an appointment scheduled for 6/15. Requesting a return call when Rx is ready. # [phone].

## 2021-10-26 ENCOUNTER — Other Ambulatory Visit: Payer: Self-pay | Admitting: Adult Health

## 2021-10-26 DIAGNOSIS — F411 Generalized anxiety disorder: Secondary | ICD-10-CM

## 2021-11-12 ENCOUNTER — Other Ambulatory Visit: Payer: Self-pay

## 2021-11-12 ENCOUNTER — Telehealth: Payer: Self-pay | Admitting: Adult Health

## 2021-11-12 DIAGNOSIS — F909 Attention-deficit hyperactivity disorder, unspecified type: Secondary | ICD-10-CM

## 2021-11-12 MED ORDER — LISDEXAMFETAMINE DIMESYLATE 40 MG PO CAPS: 40.0000 mg | ORAL_CAPSULE | Freq: Every day | ORAL | 0 refills | Status: DC

## 2021-11-12 NOTE — Telephone Encounter (Signed)
Pended.

## 2021-11-12 NOTE — Telephone Encounter (Signed)
Next visit is 11/20/21. Requesting refill on Vyvanse called to:  [facility], [address] [place], [place] [address]  Pharmacy phone number is [phone]

## 2021-11-20 ENCOUNTER — Ambulatory Visit (INDEPENDENT_AMBULATORY_CARE_PROVIDER_SITE_OTHER): Payer: Self-pay | Admitting: Adult Health

## 2021-11-20 DIAGNOSIS — F489 Nonpsychotic mental disorder, unspecified: Secondary | ICD-10-CM

## 2021-11-20 NOTE — Progress Notes (Signed)
Patient no show appointment. ? ?

## 2021-12-10 ENCOUNTER — Ambulatory Visit (INDEPENDENT_AMBULATORY_CARE_PROVIDER_SITE_OTHER): Payer: 59 | Admitting: Adult Health

## 2021-12-10 ENCOUNTER — Telehealth: Payer: Self-pay | Admitting: Adult Health

## 2021-12-10 ENCOUNTER — Encounter: Payer: Self-pay | Admitting: Adult Health

## 2021-12-10 DIAGNOSIS — F411 Generalized anxiety disorder: Secondary | ICD-10-CM | POA: Diagnosis not present

## 2021-12-10 DIAGNOSIS — F909 Attention-deficit hyperactivity disorder, unspecified type: Secondary | ICD-10-CM

## 2021-12-10 DIAGNOSIS — G4700 Insomnia, unspecified: Secondary | ICD-10-CM | POA: Diagnosis not present

## 2021-12-10 DIAGNOSIS — F331 Major depressive disorder, recurrent, moderate: Secondary | ICD-10-CM

## 2021-12-10 NOTE — Progress Notes (Signed)
Devin Walker [id number] 04/01/1991 31 y.o.  Subjective:   Patient ID:  Devin Walker is a 31 y.o. (DOB March 12, 1991) male.  Chief Complaint: No chief complaint on file.   HPI Devin Walker presents to the office today for follow-up of GAD, MDD, insomnia and ADHD.  Describes mood today as "better". Pleasant. Denies tearfulness. Mood symptoms - reports decreased depression, anxiety, and irritability. Mood is consistent. Stating "I just feel blah al the time". Feels like his emotions are "muted". Typically able to identify feelings - but feels kinda "meh" all the time. Concerned that medication may be causing some emotional blunting - willing to decrease dose of Lexapro. Family doing well. Improved interest and motivation. Taking medications a prescribed.  Energy levels vary Active, does not have a regular exercise routine.   Enjoys some usual interests and activities. Married. Lives with wife and 4 children. Mother local and supportive.  Appetite adequate. Weight stable - 145 pounds. Sleeping better some nights than others. Averges 6 hours.  Focus and concentration has been "ok" during the work day. Completing tasks. Managing minimal aspects of household. Works full time. Denies SI or HI.  Denies AH or VH.  Denies self harm. Denies substance use.        Review of Systems:  Review of Systems  Musculoskeletal:  Negative for gait problem.  Neurological:  Negative for tremors.  Psychiatric/Behavioral:         Please refer to HPI    Medications: I have reviewed the patient's current medications.  Current Outpatient Medications  Medication Sig Dispense Refill   busPIRone (BUSPAR) 15 MG tablet TAKE ONE TABLET BY MOUTH THREE TIMES A DAY 90 tablet 2   escitalopram (LEXAPRO) 20 MG tablet Take 1 tablet (20 mg total) by mouth daily. 30 tablet 5   lisdexamfetamine (VYVANSE) 40 MG capsule Take 1 capsule (40 mg total) by mouth daily. 30 capsule 0   traZODone (DESYREL) 50 MG tablet Take 1 tablet  (50 mg total) by mouth at bedtime. 30 tablet 2   No current facility-administered medications for this visit.    Medication Side Effects: None  Allergies: No Known Allergies  No past medical history on file.  Past Medical History, Surgical history, Social history, and Family history were reviewed and updated as appropriate.   Please see review of systems for further details on the patient's review from today.   Objective:   Physical Exam:  There were no vitals taken for this visit.  Physical Exam Constitutional:      General: He is not in acute distress. Musculoskeletal:        General: No deformity.  Neurological:     Mental Status: He is alert and oriented to person, place, and time.     Coordination: Coordination normal.  Psychiatric:        Attention and Perception: Attention and perception normal. He does not perceive auditory or visual hallucinations.        Mood and Affect: Mood normal. Mood is not anxious or depressed. Affect is not labile, blunt, angry or inappropriate.        Speech: Speech normal.        Behavior: Behavior normal.        Thought Content: Thought content normal. Thought content is not paranoid or delusional. Thought content does not include homicidal or suicidal ideation. Thought content does not include homicidal or suicidal plan.        Cognition and Memory: Cognition and memory normal.  Judgment: Judgment normal.     Comments: Insight intact     Lab Review:  No results found for: "NA", "K", "CL", "CO2", "GLUCOSE", "BUN", "CREATININE", "CALCIUM", "PROT", "ALBUMIN", "AST", "ALT", "ALKPHOS", "BILITOT", "GFRNONAA", "GFRAA"  No results found for: "WBC", "RBC", "HGB", "HCT", "PLT", "MCV", "MCH", "MCHC", "RDW", "LYMPHSABS", "MONOABS", "EOSABS", "BASOSABS"  No results found for: "POCLITH", "LITHIUM"   No results found for: "PHENYTOIN", "PHENOBARB", "VALPROATE", "CBMZ"   .res Assessment: Plan:    Plan:  1. Decrease Lexapro 20mg  to 10mg   daily 2. Vyvanse 40mg  daily - will call for an increase if needed. 3. Buspar 15mg  TID  No longer working with a therapist  BP WNL  RTC 3 months  Time spent with patient was 30 minutes. Greater than 50% of face to face time with patient was spent on counseling and coordination of care.   Patient totally disabled and unable to work. Patient out of work from 03/13/2021 through 07/09/2021. Will reassess a return to work date at next appointment.  Patient advised to contact office with any questions, adverse effects, or acute worsening in signs and symptoms.  Discussed potential benefits, risks, and side effects of stimulants with patient to include increased heart rate, palpitations, insomnia, increased anxiety, increased irritability, or decreased appetite. Instructed patient to contact office if experiencing any significant tolerability issues. Diagnoses and all orders for this visit:  Attention deficit hyperactivity disorder (ADHD), unspecified ADHD type  Generalized anxiety disorder  Insomnia, unspecified type  Major depressive disorder, recurrent episode, moderate ([facility])     Please see After Visit Summary for patient specific instructions.  No future appointments.  No orders of the defined types were placed in this encounter.   -------------------------------

## 2021-12-10 NOTE — Telephone Encounter (Signed)
Last filled 7/17, due 8/14

## 2021-12-10 NOTE — Telephone Encounter (Signed)
Patient forgot to mention to RM during his appt today that he needs refill on Vyvanse 40mg . Ph: [phone] Pharmacy [address] [place]

## 2021-12-11 ENCOUNTER — Other Ambulatory Visit: Payer: Self-pay

## 2021-12-11 DIAGNOSIS — F909 Attention-deficit hyperactivity disorder, unspecified type: Secondary | ICD-10-CM

## 2021-12-11 MED ORDER — LISDEXAMFETAMINE DIMESYLATE 40 MG PO CAPS: 40.0000 mg | ORAL_CAPSULE | Freq: Every day | ORAL | 0 refills | Status: DC

## 2021-12-11 NOTE — Telephone Encounter (Signed)
Pended.

## 2021-12-27 ENCOUNTER — Encounter: Payer: Self-pay | Admitting: Adult Health

## 2021-12-27 ENCOUNTER — Ambulatory Visit (INDEPENDENT_AMBULATORY_CARE_PROVIDER_SITE_OTHER): Payer: 59 | Admitting: Adult Health

## 2021-12-27 DIAGNOSIS — F411 Generalized anxiety disorder: Secondary | ICD-10-CM

## 2021-12-27 DIAGNOSIS — G4700 Insomnia, unspecified: Secondary | ICD-10-CM | POA: Diagnosis not present

## 2021-12-27 DIAGNOSIS — F909 Attention-deficit hyperactivity disorder, unspecified type: Secondary | ICD-10-CM

## 2021-12-27 DIAGNOSIS — F331 Major depressive disorder, recurrent, moderate: Secondary | ICD-10-CM

## 2021-12-27 MED ORDER — ESCITALOPRAM OXALATE 10 MG PO TABS
10.0000 mg | ORAL_TABLET | Freq: Every day | ORAL | 5 refills | Status: DC
Start: 2021-12-27 — End: 2022-04-05

## 2021-12-27 MED ORDER — AMPHETAMINE-DEXTROAMPHET ER 20 MG PO CP24
20.0000 mg | ORAL_CAPSULE | Freq: Two times a day (BID) | ORAL | 0 refills | Status: DC
Start: 2021-12-27 — End: 2022-01-28

## 2021-12-27 MED ORDER — AMPHETAMINE-DEXTROAMPHET ER 20 MG PO CP24: 20.0000 mg | ORAL_CAPSULE | Freq: Every day | ORAL | 0 refills | Status: DC

## 2021-12-27 MED ORDER — BUSPIRONE HCL 15 MG PO TABS: 15.0000 mg | ORAL_TABLET | Freq: Three times a day (TID) | ORAL | 2 refills | Status: DC

## 2021-12-27 NOTE — Progress Notes (Signed)
Devin Walker [id number] November 05, 1990 31 y.o.  Subjective:   Patient ID:  Devin Walker is a 31 y.o. (DOB [birth year]) male.  Chief Complaint: No chief complaint on file.   HPI Devin Walker presents to the office today for follow-up of GAD, MDD, insomnia and ADHD.  Describes mood today as "better". Pleasant. Denies tearfulness. Mood symptoms - reports decreased depression, anxiety and irritability. Mood is consistent. Stating "I'm doing better". Has decreased the Lexapro to 10mg  and feels he is more "emotionally appropriate". Having issues with Vyvanse - "not feeling productive" - difficulties with sleep. Would like to switch back to Adderall XR formula. Family doing well. Improved interest and motivation. Taking medications a prescribed.  Energy levels vary Active, does not have a regular exercise routine.   Enjoys some usual interests and activities. Married. Lives with wife and 4 children. Mother local and supportive.  Appetite adequate. Weight stable - 145 pounds. Sleeping better some nights than others. Averges 6 hours.  Focus and concentration has been "ok" during the work day. Completing tasks. Managing minimal aspects of household. Works full time. Denies SI or HI.  Denies AH or VH.  Denies self harm. Denies substance use.       Review of Systems:  Review of Systems  Musculoskeletal:  Negative for gait problem.  Neurological:  Negative for tremors.  Psychiatric/Behavioral:         Please refer to HPI    Medications: I have reviewed the patient's current medications.  Current Outpatient Medications  Medication Sig Dispense Refill   amphetamine-dextroamphetamine (ADDERALL XR) 20 MG 24 hr capsule Take 1 capsule (20 mg total) by mouth 2 (two) times daily. 60 capsule 0   busPIRone (BUSPAR) 15 MG tablet Take 1 tablet (15 mg total) by mouth 3 (three) times daily. 90 tablet 2   escitalopram (LEXAPRO) 10 MG tablet Take 1 tablet (10 mg total) by mouth daily. 30 tablet 5   No  current facility-administered medications for this visit.    Medication Side Effects: None  Allergies: No Known Allergies  No past medical history on file.  Past Medical History, Surgical history, Social history, and Family history were reviewed and updated as appropriate.   Please see review of systems for further details on the patient's review from today.   Objective:   Physical Exam:  There were no vitals taken for this visit.  Physical Exam Constitutional:      General: He is not in acute distress. Musculoskeletal:        General: No deformity.  Neurological:     Mental Status: He is alert and oriented to person, place, and time.     Coordination: Coordination normal.  Psychiatric:        Attention and Perception: Attention and perception normal. He does not perceive auditory or visual hallucinations.        Mood and Affect: Mood normal. Mood is not anxious or depressed. Affect is not labile, blunt, angry or inappropriate.        Speech: Speech normal.        Behavior: Behavior normal.        Thought Content: Thought content normal. Thought content is not paranoid or delusional. Thought content does not include homicidal or suicidal ideation. Thought content does not include homicidal or suicidal plan.        Cognition and Memory: Cognition and memory normal.        Judgment: Judgment normal.     Comments: Insight intact  Lab Review:  No results found for: "NA", "K", "CL", "CO2", "GLUCOSE", "BUN", "CREATININE", "CALCIUM", "PROT", "ALBUMIN", "AST", "ALT", "ALKPHOS", "BILITOT", "GFRNONAA", "GFRAA"  No results found for: "WBC", "RBC", "HGB", "HCT", "PLT", "MCV", "MCH", "MCHC", "RDW", "LYMPHSABS", "MONOABS", "EOSABS", "BASOSABS"  No results found for: "POCLITH", "LITHIUM"   No results found for: "PHENYTOIN", "PHENOBARB", "VALPROATE", "CBMZ"   .res Assessment: Plan:     Plan:  Continue Lexapro 10mg  daily Buspar 15mg  TID  D/C Vyvanse 40mg  daily  Add Adderall  XR 20mg  BID  122/83/79  BP WNL  RTC 3 months  Discussed work accommodations in the home setting  Time spent with patient was 30 minutes. Greater than 50% of face to face time with patient was spent on counseling and coordination of care.   Patient advised to contact office with any questions, adverse effects, or acute worsening in signs and symptoms.  Discussed potential benefits, risks, and side effects of stimulants with patient to include increased heart rate, palpitations, insomnia, increased anxiety, increased irritability, or decreased appetite. Instructed patient to contact office if experiencing any significant tolerability issues.   Diagnoses and all orders for this visit:  Attention deficit hyperactivity disorder (ADHD), unspecified ADHD type -     Discontinue: amphetamine-dextroamphetamine (ADDERALL XR) 20 MG 24 hr capsule; Take 1 capsule (20 mg total) by mouth daily. -     amphetamine-dextroamphetamine (ADDERALL XR) 20 MG 24 hr capsule; Take 1 capsule (20 mg total) by mouth 2 (two) times daily.  Generalized anxiety disorder -     busPIRone (BUSPAR) 15 MG tablet; Take 1 tablet (15 mg total) by mouth 3 (three) times daily. -     escitalopram (LEXAPRO) 10 MG tablet; Take 1 tablet (10 mg total) by mouth daily.  Major depressive disorder, recurrent episode, moderate ([facility]) -     escitalopram (LEXAPRO) 10 MG tablet; Take 1 tablet (10 mg total) by mouth daily.  Insomnia, unspecified type     Please see After Visit Summary for patient specific instructions.  No future appointments.  No orders of the defined types were placed in this encounter.   -------------------------------

## 2022-01-03 ENCOUNTER — Ambulatory Visit (INDEPENDENT_AMBULATORY_CARE_PROVIDER_SITE_OTHER): Payer: Self-pay | Admitting: Adult Health

## 2022-01-03 DIAGNOSIS — F489 Nonpsychotic mental disorder, unspecified: Secondary | ICD-10-CM

## 2022-01-03 NOTE — Progress Notes (Signed)
Patient no show appointment. ? ?

## 2022-01-28 ENCOUNTER — Telehealth: Payer: Self-pay | Admitting: Adult Health

## 2022-01-28 ENCOUNTER — Other Ambulatory Visit: Payer: Self-pay

## 2022-01-28 DIAGNOSIS — F909 Attention-deficit hyperactivity disorder, unspecified type: Secondary | ICD-10-CM

## 2022-01-28 MED ORDER — AMPHETAMINE-DEXTROAMPHET ER 20 MG PO CP24: 20.0000 mg | ORAL_CAPSULE | Freq: Two times a day (BID) | ORAL | 0 refills | Status: DC

## 2022-01-28 MED ORDER — AMPHETAMINE-DEXTROAMPHET ER 20 MG PO CP24
20.0000 mg | ORAL_CAPSULE | Freq: Two times a day (BID) | ORAL | 0 refills | Status: DC
Start: 2022-03-25 — End: 2022-03-26

## 2022-01-28 NOTE — Telephone Encounter (Signed)
Pended.

## 2022-01-28 NOTE — Telephone Encounter (Signed)
Pt requesting refill of Adderall to [facility].  Next appt 12/4

## 2022-03-25 ENCOUNTER — Telehealth: Payer: Self-pay | Admitting: Adult Health

## 2022-03-25 NOTE — Telephone Encounter (Signed)
Mailbox is full.

## 2022-03-25 NOTE — Telephone Encounter (Signed)
Devin Walker called at 3:31 and LM that he is having an issue with his medication.  He is having an adverse reaction that is causing negative issues.  He needs resolved ASAP.  Please call to discuss.

## 2022-03-26 ENCOUNTER — Other Ambulatory Visit: Payer: Self-pay

## 2022-03-26 ENCOUNTER — Telehealth: Payer: Self-pay | Admitting: Adult Health

## 2022-03-26 MED ORDER — AMPHETAMINE-DEXTROAMPHET ER 20 MG PO CP24: 20.0000 mg | ORAL_CAPSULE | Freq: Two times a day (BID) | ORAL | 0 refills | Status: DC

## 2022-03-26 NOTE — Telephone Encounter (Signed)
Patient is taking [employer] brand generic Adderall. He said he has had headaches every day since he has been taking it. He stopped taking it for a few days and didn't have a headache, and then when he started back the headaches returned. He has had no SOB, no pulse racing, has not taken his BP. He said he also seemed to be more emotional. He was due for a RF yesterday, has not picked up yet. He would like to try brand Adderall. He is aware that it will probably require a PA and that it may take a couple of days.  Will pend.

## 2022-03-26 NOTE — Telephone Encounter (Signed)
Patient  lvm today at 3:36 following up on previous msg concerning the generic mfg for the Adderall. He is requesting a return call on the status due to being without medication for sometime.  Contact information # [phone]

## 2022-03-26 NOTE — Telephone Encounter (Signed)
Ok to pend. May also request a different generic mfg.

## 2022-03-27 ENCOUNTER — Telehealth: Payer: Self-pay

## 2022-03-27 NOTE — Telephone Encounter (Signed)
PA needed for brand. Patient is aware this was likely to be required.

## 2022-03-27 NOTE — Telephone Encounter (Addendum)
Prior Authorization Brand Adderall 20 mg XR #60  [employer]  PA denied. Patient notified.

## 2022-03-29 ENCOUNTER — Other Ambulatory Visit: Payer: Self-pay

## 2022-03-29 MED ORDER — AMPHETAMINE-DEXTROAMPHET ER 20 MG PO CP24: 20.0000 mg | ORAL_CAPSULE | Freq: Two times a day (BID) | ORAL | 0 refills | Status: DC

## 2022-03-29 NOTE — Telephone Encounter (Signed)
PA was denied. Patient notified and he requested I send Rx to [facility] on [address] for generic.  Pended.

## 2022-04-01 ENCOUNTER — Ambulatory Visit (INDEPENDENT_AMBULATORY_CARE_PROVIDER_SITE_OTHER): Payer: Self-pay | Admitting: Adult Health

## 2022-04-01 DIAGNOSIS — F489 Nonpsychotic mental disorder, unspecified: Secondary | ICD-10-CM

## 2022-04-01 NOTE — Progress Notes (Signed)
Patient no show appointment. ? ?

## 2022-04-05 ENCOUNTER — Ambulatory Visit (INDEPENDENT_AMBULATORY_CARE_PROVIDER_SITE_OTHER): Payer: 59 | Admitting: Adult Health

## 2022-04-05 ENCOUNTER — Encounter: Payer: Self-pay | Admitting: Adult Health

## 2022-04-05 DIAGNOSIS — F909 Attention-deficit hyperactivity disorder, unspecified type: Secondary | ICD-10-CM | POA: Diagnosis not present

## 2022-04-05 DIAGNOSIS — F411 Generalized anxiety disorder: Secondary | ICD-10-CM | POA: Diagnosis not present

## 2022-04-05 DIAGNOSIS — G4700 Insomnia, unspecified: Secondary | ICD-10-CM

## 2022-04-05 DIAGNOSIS — F331 Major depressive disorder, recurrent, moderate: Secondary | ICD-10-CM

## 2022-04-05 MED ORDER — BUSPIRONE HCL 15 MG PO TABS: 15.0000 mg | ORAL_TABLET | Freq: Three times a day (TID) | ORAL | 2 refills | Status: AC

## 2022-04-05 MED ORDER — ESCITALOPRAM OXALATE 10 MG PO TABS: 10.0000 mg | ORAL_TABLET | Freq: Every day | ORAL | 5 refills | Status: DC

## 2022-04-05 NOTE — Progress Notes (Signed)
Devin Walker [id number] 1991/02/18 31 y.o.  Subjective:   Patient ID:  Devin Walker is a 31 y.o. (DOB 10/26/1990) male.  Chief Complaint: No chief complaint on file.   HPI Shalamar Crays presents to the office today for follow-up of GAD, MDD, insomnia and ADHD.  Describes mood today as "better". Pleasant. Denies tearfulness. Mood symptoms - reports depression, anxiety and irritability. Reports increased intrusive thoughts - "am I enough - am I good enough". Mood is lower. Stating "I'm not doing to good - I feel overwhelmed". Family went through illness - 6 weeks with all family members unwell at different times. Recently lost his dog - emotional support. Grandfather's health is declining. Feels like his last prescription of Adderall was ineffective - toleration issues - caused disassociation. Stating "I feel like I'm spiraling". Working with employee relations. Has worked with a therapist in the past and feel it was helpful, but could not afford it - restarting again next week through employer. Stating right now "I feel isolated". Struggling at home and work. Trying to stop and slow down, but can't. Stating "I only feel comfortable when I'm alone". Does not feel like he is able to continue to work at this time. Taking medications as prescribed - "medications are helpful, but I need more than medications". Feels like he needs to take some time away from the work setting. Family doing well. Improved interest and motivation. Taking medications a prescribed.  Energy levels vary Active, does not have a regular exercise routine.   Enjoys some usual interests and activities. Married. Lives with wife and 4 children. Mother local and supportive.  Appetite adequate. Weight stable - 140 pounds. Sleep has declined. Reports difficulties shutting his mind off. Averages 4 hours.  Focus and concentration difficulties. Completing tasks. Managing minimal aspects of household. Works full time. Denies SI or HI.  Denies  AH or VH.  Denies self harm. Denies substance use - using Kratom - drinking energy drinks.   Review of Systems:  Review of Systems  Musculoskeletal:  Negative for gait problem.  Neurological:  Negative for tremors.  Psychiatric/Behavioral:         Please refer to HPI    Medications: I have reviewed the patient's current medications.  Current Outpatient Medications  Medication Sig Dispense Refill   amphetamine-dextroamphetamine (ADDERALL XR) 20 MG 24 hr capsule Take 1 capsule (20 mg total) by mouth 2 (two) times daily. 60 capsule 0   busPIRone (BUSPAR) 15 MG tablet Take 1 tablet (15 mg total) by mouth 3 (three) times daily. 90 tablet 2   escitalopram (LEXAPRO) 10 MG tablet Take 1 tablet (10 mg total) by mouth daily. 30 tablet 5   No current facility-administered medications for this visit.    Medication Side Effects: None  Allergies: No Known Allergies  No past medical history on file.  Past Medical History, Surgical history, Social history, and Family history were reviewed and updated as appropriate.   Please see review of systems for further details on the patient's review from today.   Objective:   Physical Exam:  There were no vitals taken for this visit.  Physical Exam Constitutional:      General: He is not in acute distress. Musculoskeletal:        General: No deformity.  Neurological:     Mental Status: He is alert and oriented to person, place, and time.     Coordination: Coordination normal.  Psychiatric:        Attention and Perception: Attention  and perception normal. He does not perceive auditory or visual hallucinations.        Mood and Affect: Mood normal. Mood is not anxious or depressed. Affect is not labile, blunt, angry or inappropriate.        Speech: Speech normal.        Behavior: Behavior normal.        Thought Content: Thought content normal. Thought content is not paranoid or delusional. Thought content does not include homicidal or suicidal  ideation. Thought content does not include homicidal or suicidal plan.        Cognition and Memory: Cognition and memory normal.        Judgment: Judgment normal.     Comments: Insight intact     Lab Review:  No results found for: "NA", "K", "CL", "CO2", "GLUCOSE", "BUN", "CREATININE", "CALCIUM", "PROT", "ALBUMIN", "AST", "ALT", "ALKPHOS", "BILITOT", "GFRNONAA", "GFRAA"  No results found for: "WBC", "RBC", "HGB", "HCT", "PLT", "MCV", "MCH", "MCHC", "RDW", "LYMPHSABS", "MONOABS", "EOSABS", "BASOSABS"  No results found for: "POCLITH", "LITHIUM"   No results found for: "PHENYTOIN", "PHENOBARB", "VALPROATE", "CBMZ"   .res Assessment: Plan:    Plan:  Continue Lexapro 10mg  daily Buspar 15mg  TID Adderall XR 20mg  BID  Out of work 04/01/2022 through 05/18/2021. Will discuss a RTW date at next visit.   Monitor BP between visits while taking stimulant medication.   RTC 6 weeks    Time spent with patient was 25 minutes. Greater than 50% of face to face time with patient was spent on counseling and coordination of care.   Patient advised to contact office with any questions, adverse effects, or acute worsening in signs and symptoms.  Discussed potential benefits, risks, and side effects of stimulants with patient to include increased heart rate, palpitations, insomnia, increased anxiety, increased irritability, or decreased appetite. Instructed patient to contact office if experiencing any significant tolerability issues.   Diagnoses and all orders for this visit:  Generalized anxiety disorder  Major depressive disorder, recurrent episode, moderate (HCC)     Please see After Visit Summary for patient specific instructions.  No future appointments.  No orders of the defined types were placed in this encounter.   -------------------------------

## 2022-04-08 ENCOUNTER — Telehealth: Payer: Self-pay | Admitting: Adult Health

## 2022-04-08 NOTE — Telephone Encounter (Signed)
Received Attending Physician's Statement forms. Given to [facility] 12/11

## 2022-04-09 NOTE — Telephone Encounter (Signed)
noted 

## 2022-04-18 NOTE — Telephone Encounter (Signed)
Pt called and advised that paperwork from [employer] has not be received.  It is due by 12/26.  Next appt 12/22

## 2022-04-18 NOTE — Telephone Encounter (Signed)
Please see message in regards to FMLA.

## 2022-04-19 ENCOUNTER — Encounter: Payer: Self-pay | Admitting: Adult Health

## 2022-04-19 ENCOUNTER — Telehealth (INDEPENDENT_AMBULATORY_CARE_PROVIDER_SITE_OTHER): Payer: 59 | Admitting: Adult Health

## 2022-04-19 DIAGNOSIS — G4700 Insomnia, unspecified: Secondary | ICD-10-CM

## 2022-04-19 DIAGNOSIS — Z0289 Encounter for other administrative examinations: Secondary | ICD-10-CM

## 2022-04-19 DIAGNOSIS — F411 Generalized anxiety disorder: Secondary | ICD-10-CM

## 2022-04-19 DIAGNOSIS — F331 Major depressive disorder, recurrent, moderate: Secondary | ICD-10-CM

## 2022-04-19 DIAGNOSIS — F909 Attention-deficit hyperactivity disorder, unspecified type: Secondary | ICD-10-CM

## 2022-04-19 NOTE — Progress Notes (Signed)
Devin Walker:[id number] [birth year] 31 y.o.  Virtual Visit via Video Note  I connected with pt @ on 04/19/22 at  1:00 PM EST by a video enabled telemedicine application and verified that I am speaking with the correct person using two identifiers.   I discussed the limitations of evaluation and management by telemedicine and the availability of in person appointments. The patient expressed understanding and agreed to proceed.  I discussed the assessment and treatment plan with the patient. The patient was provided an opportunity to ask questions and all were answered. The patient agreed with the plan and demonstrated an understanding of the instructions.   The patient was advised to call back or seek an in-person evaluation if the symptoms worsen or if the condition fails to improve as anticipated.  I provided 25 minutes of non-face-to-face time during this encounter.  The patient was located at home.  The provider was located at [place].   Aloha Gell, NP   Subjective:   Patient ID:  Devin Walker is a 31 y.o. (DOB 05-16-1990) male.  Chief Complaint: No chief complaint on file.   HPI Daquante Callis presents for follow-up of GAD, MDD, insomnia and ADHD.  Describes mood today as "about the same". Pleasant. Denies tearfulness. Mood symptoms - reports depression, anxiety and irritability - "it's been a lot at once". Reports intrusive thoughts - "not any better". Mood remains lower. Stating "I have no motivation - like I've been run into the ground". Stating "I feel like I'm apathetic to everything". Grandfather passed away yesterday - "I haven't processed that". Taking medications as prescribed. Planning to start working with a therapist - remotely. Family doing well. Improved interest and motivation. Taking medications a prescribed.  Energy levels vary Active, does not have a regular exercise routine.   Enjoys some usual interests and activities. Married. Lives with wife  children. Mother local and supportive.  Appetite adequate. Weight stable - 140 pounds. Sleep is "about the same". Averages 4 hours - difficulties shutting his mind off.  Focus and concentration difficulties - "not productive and getting things done". Completing tasks. Managing minimal aspects of household. Works full time - out of work currently. Denies SI or HI.  Denies AH or VH.  Denies self harm. Denies substance use - using Kratom - drinking energy drinks.   Review of Systems:  Review of Systems  Musculoskeletal:  Negative for gait problem.  Neurological:  Negative for tremors.  Psychiatric/Behavioral:         Please refer to HPI    Medications: I have reviewed the patient's current medications.  Current Outpatient Medications  Medication Sig Dispense Refill   amphetamine-dextroamphetamine (ADDERALL XR) 20 MG 24 hr capsule Take 1 capsule (20 mg total) by mouth 2 (two) times daily. 60 capsule 0   busPIRone (BUSPAR) 15 MG tablet Take 1 tablet (15 mg total) by mouth 3 (three) times daily. 90 tablet 2   escitalopram (LEXAPRO) 10 MG tablet Take 1 tablet (10 mg total) by mouth daily. 30 tablet 5   No current facility-administered medications for this visit.    Medication Side Effects: None  Allergies: No Known Allergies  No past medical history on file.  Family History  Problem Relation Age of Onset   Anxiety disorder Mother    Depression Mother    Anxiety disorder Father     Social History   Socioeconomic History   Marital status: Married    Spouse name: Not on file   Number of  children: Not on file   Years of education: Not on file   Highest education level: Not on file  Occupational History   Not on file  Tobacco Use   Smoking status: Former    Years: 8.00    Types: Cigarettes    Quit date: 2016    Years since quitting: 7.9   Smokeless tobacco: Never  Vaping Use   Vaping Use: Former  Substance and Sexual Activity   Alcohol use: Not Currently   Drug use:  Not on file   Sexual activity: Yes    Partners: Female  Other Topics Concern   Not on file  Social History Narrative   Not on file   Social Determinants of Health   Financial Resource Strain: Not on file  Food Insecurity: Not on file  Transportation Needs: Not on file  Physical Activity: Not on file  Stress: Not on file  Social Connections: Not on file  Intimate Partner Violence: Not on file    Past Medical History, Surgical history, Social history, and Family history were reviewed and updated as appropriate.   Please see review of systems for further details on the patient's review from today.   Objective:   Physical Exam:  There were no vitals taken for this visit.  Physical Exam Constitutional:      General: He is not in acute distress. Musculoskeletal:        General: No deformity.  Neurological:     Mental Status: He is alert and oriented to person, place, and time.     Coordination: Coordination normal.  Psychiatric:        Attention and Perception: Attention and perception normal. He does not perceive auditory or visual hallucinations.        Mood and Affect: Mood normal. Mood is not anxious or depressed. Affect is not labile, blunt, angry or inappropriate.        Speech: Speech normal.        Behavior: Behavior normal.        Thought Content: Thought content normal. Thought content is not paranoid or delusional. Thought content does not include homicidal or suicidal ideation. Thought content does not include homicidal or suicidal plan.        Cognition and Memory: Cognition and memory normal.        Judgment: Judgment normal.     Comments: Insight intact     Lab Review:  No results found for: "NA", "K", "CL", "CO2", "GLUCOSE", "BUN", "CREATININE", "CALCIUM", "PROT", "ALBUMIN", "AST", "ALT", "ALKPHOS", "BILITOT", "GFRNONAA", "GFRAA"  No results found for: "WBC", "RBC", "HGB", "HCT", "PLT", "MCV", "MCH", "MCHC", "RDW", "LYMPHSABS", "MONOABS", "EOSABS",  "BASOSABS"  No results found for: "POCLITH", "LITHIUM"   No results found for: "PHENYTOIN", "PHENOBARB", "VALPROATE", "CBMZ"   .res Assessment: Plan:    Plan:  Lexapro 10mg  daily Buspar 15mg  TID Adderall XR 20mg  BID  Picked up a prescription of Vyvanse 40mg  daily on 04/10/2022.  Planning to see a therapist - first appt was rescheduled.  Out of work 04/01/2022 through 05/18/2021. Will discuss a RTW date at next visit.   Monitor BP between visits while taking stimulant medication.   RTC 6 weeks  Time spent with patient was 25 minutes. Greater than 50% of face to face time with patient was spent on counseling and coordination of care.   Patient advised to contact office with any questions, adverse effects, or acute worsening in signs and symptoms.  Discussed potential benefits, risks, and side effects of stimulants  with patient to include increased heart rate, palpitations, insomnia, increased anxiety, increased irritability, or decreased appetite. Instructed patient to contact office if experiencing any significant tolerability issues.  Diagnoses and all orders for this visit:  Major depressive disorder, recurrent episode, moderate (HCC)  Generalized anxiety disorder  Attention deficit hyperactivity disorder (ADHD), unspecified ADHD type  Insomnia, unspecified type     Please see After Visit Summary for patient specific instructions.  No future appointments.   No orders of the defined types were placed in this encounter.     -------------------------------

## 2022-04-25 NOTE — Telephone Encounter (Signed)
Paperwork completed, signed and faxed.

## 2022-05-08 ENCOUNTER — Telehealth: Payer: Self-pay | Admitting: Adult Health

## 2022-05-08 NOTE — Telephone Encounter (Signed)
Received Attending Physician Statement re: patient. Placed in Traci's box for completion.

## 2022-05-10 ENCOUNTER — Other Ambulatory Visit: Payer: Self-pay

## 2022-05-10 ENCOUNTER — Telehealth: Payer: Self-pay | Admitting: Adult Health

## 2022-05-10 MED ORDER — LISDEXAMFETAMINE DIMESYLATE 40 MG PO CAPS: 40.0000 mg | ORAL_CAPSULE | ORAL | 0 refills | Status: DC

## 2022-05-10 NOTE — Telephone Encounter (Signed)
Patient called in for refill on Vyvanse. Ph: [phone] DQQI 1/25 [facility] [place]

## 2022-05-10 NOTE — Telephone Encounter (Signed)
Pended.

## 2022-05-12 NOTE — Telephone Encounter (Signed)
Clarification, pt's last office visit was 04/19/22 that paper work was faxed and no visits since then. Next apt is 05/22/22 will completed after his next visit.

## 2022-05-12 NOTE — Telephone Encounter (Signed)
Paper work completed, will have Rollene Fare review and sign

## 2022-05-14 ENCOUNTER — Other Ambulatory Visit: Payer: Self-pay

## 2022-05-14 MED ORDER — VYVANSE 40 MG PO CAPS: 40.0000 mg | ORAL_CAPSULE | ORAL | 0 refills | Status: DC

## 2022-05-14 NOTE — Telephone Encounter (Signed)
Patient Devin Walker at 3:47 today stating the medication submitted for refill was not available in the generic [facility] at [facility]. [facility] stated [facility] is [facility] of [facility]. Razi is requesting the name brand submitted to [facility] instead.  Contact the patient to confirm new Rx submitted per his request # [phone]  Appointment 05/23/22

## 2022-05-14 NOTE — Telephone Encounter (Signed)
Pended.

## 2022-05-23 ENCOUNTER — Telehealth (INDEPENDENT_AMBULATORY_CARE_PROVIDER_SITE_OTHER): Payer: 59 | Admitting: Adult Health

## 2022-05-23 ENCOUNTER — Encounter: Payer: Self-pay | Admitting: Adult Health

## 2022-05-23 DIAGNOSIS — G4700 Insomnia, unspecified: Secondary | ICD-10-CM | POA: Diagnosis not present

## 2022-05-23 DIAGNOSIS — F331 Major depressive disorder, recurrent, moderate: Secondary | ICD-10-CM

## 2022-05-23 DIAGNOSIS — F411 Generalized anxiety disorder: Secondary | ICD-10-CM | POA: Diagnosis not present

## 2022-05-23 DIAGNOSIS — F909 Attention-deficit hyperactivity disorder, unspecified type: Secondary | ICD-10-CM | POA: Diagnosis not present

## 2022-05-23 NOTE — Progress Notes (Signed)
Devin Walker [id number] 1991/02/20 32 y.o.  Subjective:   Patient ID:  Devin Walker is a 32 y.o. (DOB 1990-12-28) male.  Chief Complaint: No chief complaint on file.   HPI Mohmmad Saleeby presents to the office today for follow-up of GAD, MDD, insomnia and ADHD.  Describes mood today as "about the same". Pleasant. Denies tearfulness. Mood symptoms - reports depression, anxiety and irritability. Reports worry, rumination, and over thinking. Reports intrusive thoughts. Mood remains lower. Stating "I'm going through a lot". Reports increased situational stressors. Grandfather passed away over the holidays. Has met with a therapist twice and is hopeful. Varying interest and motivation. Taking medications a prescribed. Does not feel like he is ready to return to the work setting.  Energy levels variable. Active, does not have a regular exercise routine.   Enjoys some usual interests and activities. Married. Lives with wife children. Mother local and supportive.  Appetite adequate. Weight stable - 140 pounds. Reports sleeping difficulties. Averages 3 to 4 hours for 3 to 4 days and will then 10 hours. Reports consuming less energy drinks - 1 daily. Stops caffeine use 6.  Focus and concentration difficulties - switched back from Adderall to Vyvanase. Completing tasks. Managing minimal aspects of household. Works full time - out of work currently. Denies SI or HI.  Denies AH or VH.  Denies self harm. Denies substance use - using less Kratom (plans to d/c) - drinking energy drinks.   Seeing a therapist - 2 visits.   Review of Systems:  Review of Systems  Musculoskeletal:  Negative for gait problem.  Neurological:  Negative for tremors.  Psychiatric/Behavioral:         Please refer to HPI   Medications: I have reviewed the patient's current medications.  Current Outpatient Medications  Medication Sig Dispense Refill   busPIRone (BUSPAR) 15 MG tablet Take 1 tablet (15 mg total) by mouth 3  (three) times daily. 90 tablet 2   escitalopram (LEXAPRO) 10 MG tablet Take 1 tablet (10 mg total) by mouth daily. 30 tablet 5   VYVANSE 40 MG capsule Take 1 capsule (40 mg total) by mouth every morning. 30 capsule 0   No current facility-administered medications for this visit.    Medication Side Effects: None  Allergies: No Known Allergies  No past medical history on file.  Past Medical History, Surgical history, Social history, and Family history were reviewed and updated as appropriate.   Please see review of systems for further details on the patient's review from today.   Objective:   Physical Exam:  There were no vitals taken for this visit.  Physical Exam Constitutional:      General: He is not in acute distress. Musculoskeletal:        General: No deformity.  Neurological:     Mental Status: He is alert and oriented to person, place, and time.     Coordination: Coordination normal.  Psychiatric:        Attention and Perception: Attention and perception normal. He does not perceive auditory or visual hallucinations.        Mood and Affect: Mood normal. Mood is not anxious or depressed. Affect is not labile, blunt, angry or inappropriate.        Speech: Speech normal.        Behavior: Behavior normal.        Thought Content: Thought content normal. Thought content is not paranoid or delusional. Thought content does not include homicidal or suicidal ideation. Thought content does  not include homicidal or suicidal plan.        Cognition and Memory: Cognition and memory normal.        Judgment: Judgment normal.     Comments: Insight intact     Lab Review:  No results found for: "NA", "K", "CL", "CO2", "GLUCOSE", "BUN", "CREATININE", "CALCIUM", "PROT", "ALBUMIN", "AST", "ALT", "ALKPHOS", "BILITOT", "GFRNONAA", "GFRAA"  No results found for: "WBC", "RBC", "HGB", "HCT", "PLT", "MCV", "MCH", "MCHC", "RDW", "LYMPHSABS", "MONOABS", "EOSABS", "BASOSABS"  No results found  for: "POCLITH", "LITHIUM"   No results found for: "PHENYTOIN", "PHENOBARB", "VALPROATE", "CBMZ"   .res Assessment: Plan:    Plan:  Lexapro 10mg  daily Buspar 15mg  TID  Restart Vyvanse 40mg  daily  D/C Adderall XR 20mg  BID  Started seeing a therapist - has 2 visits.   Out of work 05/20/2022 through 07/18/2021. Will discuss a RTW date at next visit.   Monitor BP between visits while taking stimulant medication.   RTC 6 weeks  Time spent with patient was 25 minutes. Greater than 50% of face to face time with patient was spent on counseling and coordination of care.   Patient advised to contact office with any questions, adverse effects, or acute worsening in signs and symptoms.  Discussed potential benefits, risks, and side effects of stimulants with patient to include increased heart rate, palpitations, insomnia, increased anxiety, increased irritability, or decreased appetite. Instructed patient to contact office if experiencing any significant tolerability issues.   Diagnoses and all orders for this visit:  Major depressive disorder, recurrent episode, moderate (HCC)  Generalized anxiety disorder  Attention deficit hyperactivity disorder (ADHD), unspecified ADHD type  Insomnia, unspecified type     Please see After Visit Summary for patient specific instructions.  No future appointments.   No orders of the defined types were placed in this encounter.   -------------------------------

## 2022-05-24 ENCOUNTER — Telehealth: Payer: Self-pay

## 2022-05-24 DIAGNOSIS — Z0289 Encounter for other administrative examinations: Secondary | ICD-10-CM

## 2022-05-24 NOTE — Telephone Encounter (Signed)
Pt's STD form along with last office visit from 05/23/2022 faxed earlier today to [facility].

## 2022-06-28 ENCOUNTER — Telehealth (INDEPENDENT_AMBULATORY_CARE_PROVIDER_SITE_OTHER): Payer: Self-pay | Admitting: Adult Health

## 2022-06-28 DIAGNOSIS — F489 Nonpsychotic mental disorder, unspecified: Secondary | ICD-10-CM

## 2022-06-28 NOTE — Progress Notes (Signed)
Patient no show appointment. ? ?

## 2022-07-10 ENCOUNTER — Other Ambulatory Visit: Payer: Self-pay | Admitting: Adult Health

## 2022-07-10 ENCOUNTER — Telehealth: Payer: Self-pay | Admitting: Adult Health

## 2022-07-10 DIAGNOSIS — F909 Attention-deficit hyperactivity disorder, unspecified type: Secondary | ICD-10-CM

## 2022-07-10 MED ORDER — VYVANSE 40 MG PO CAPS: 40.0000 mg | ORAL_CAPSULE | ORAL | 0 refills | Status: DC

## 2022-07-10 NOTE — Telephone Encounter (Signed)
Script sent  

## 2022-07-10 NOTE — Telephone Encounter (Signed)
Pt is requesting RF on Vyvanse '40mg'$  He R/S for 3/18. He would like to get it name brand if possible. [facility] [address]

## 2022-07-15 ENCOUNTER — Telehealth (INDEPENDENT_AMBULATORY_CARE_PROVIDER_SITE_OTHER): Payer: 59 | Admitting: Adult Health

## 2022-07-15 ENCOUNTER — Encounter: Payer: Self-pay | Admitting: Adult Health

## 2022-07-15 DIAGNOSIS — F411 Generalized anxiety disorder: Secondary | ICD-10-CM

## 2022-07-15 DIAGNOSIS — G4700 Insomnia, unspecified: Secondary | ICD-10-CM

## 2022-07-15 DIAGNOSIS — F909 Attention-deficit hyperactivity disorder, unspecified type: Secondary | ICD-10-CM

## 2022-07-15 DIAGNOSIS — F331 Major depressive disorder, recurrent, moderate: Secondary | ICD-10-CM

## 2022-07-15 MED ORDER — METHYLPHENIDATE HCL ER (OSM) 27 MG PO TBCR: 27.0000 mg | EXTENDED_RELEASE_TABLET | ORAL | 0 refills | Status: DC

## 2022-07-15 NOTE — Progress Notes (Signed)
Devin Walker [id number] 1991-02-09 32 y.o.  Virtual Visit via Video Note  I connected with pt @ on 07/15/22 at  4:40 PM EDT by a video enabled telemedicine application and verified that I am speaking with the correct person using two identifiers.   I discussed the limitations of evaluation and management by telemedicine and the availability of in person appointments. The patient expressed understanding and agreed to proceed.  I discussed the assessment and treatment plan with the patient. The patient was provided an opportunity to ask questions and all were answered. The patient agreed with the plan and demonstrated an understanding of the instructions.   The patient was advised to call back or seek an in-person evaluation if the symptoms worsen or if the condition fails to improve as anticipated.  I provided 25 minutes of non-face-to-face time during this encounter.  The patient was located at home.  The provider was located at [place].   Devin Gell, NP   Subjective:   Patient ID:  Devin Walker is a 32 y.o. (DOB Mar 04, 1991) male.  Chief Complaint: No chief complaint on file.   HPI Devin Walker presents for follow-up of GAD, MDD, insomnia and ADHD.  Describes mood today as "not any better". Pleasant. Denies tearfulness. Mood symptoms - reports depression, anxiety and irritability. Reports worry, rumination, and over thinking. Reports intrusive thoughts. Reports intrusive thoughts of his self worth. Reports self defeating thoughts. Mood is low. Reports ongoing situational stressors. Stating "I'm not doing any better". Has not tolerated the Vyvanse and would like to discontinue it. Is willing to try Concerta - took as a child. Varying interest and motivation. Taking medications a prescribed. Does not feel like he is ready to return to the work setting.  Energy levels lower. Active, has started exercising over the past few weeks.  Enjoys some usual interests and  activities. Married. Lives with wife children. Mother local and supportive.  Appetite adequate. Weight stable - 140 pounds. Reports a variable sleep schedule. Going to bed and taking hours to get to sleep, then getting 3 to 4 hours. Reports decreased caffeine use.  Focus and concentration difficulties. Has not tolerated the Adderall and Vyvanse. Completing tasks. Managing minimal aspects of household. Works full time - out of work currently. Denies SI or HI.  Denies AH or VH.  Denies self harm. Denies substance use - using less Kratom - struggling to discontinue use. Drinking one energy drink.  Seeing a therapist - 3 to 4 visits.     Review of Systems:  Review of Systems  Musculoskeletal:  Negative for gait problem.  Neurological:  Negative for tremors.  Psychiatric/Behavioral:         Please refer to HPI    Medications: I have reviewed the patient's current medications.  Current Outpatient Medications  Medication Sig Dispense Refill   busPIRone (BUSPAR) 15 MG tablet Take 1 tablet (15 mg total) by mouth 3 (three) times daily. 90 tablet 2   escitalopram (LEXAPRO) 10 MG tablet Take 1 tablet (10 mg total) by mouth daily. 30 tablet 5   VYVANSE 40 MG capsule Take 1 capsule (40 mg total) by mouth every morning. 30 capsule 0   No current facility-administered medications for this visit.    Medication Side Effects: None  Allergies: No Known Allergies  No past medical history on file.  Family History  Problem Relation Age of Onset   Anxiety disorder Mother    Depression Mother    Anxiety disorder Father  Social History   Socioeconomic History   Marital status: Married    Spouse name: Not on file   Number of children: Not on file   Years of education: Not on file   Highest education level: Not on file  Occupational History   Not on file  Tobacco Use   Smoking status: Former    Years: 8    Types: Cigarettes    Quit date: 2016    Years since quitting: 8.2    Smokeless tobacco: Never  Vaping Use   Vaping Use: Former  Substance and Sexual Activity   Alcohol use: Not Currently   Drug use: Not on file   Sexual activity: Yes    Partners: Female  Other Topics Concern   Not on file  Social History Narrative   Not on file   Social Determinants of Health   Financial Resource Strain: Not on file  Food Insecurity: Not on file  Transportation Needs: Not on file  Physical Activity: Not on file  Stress: Not on file  Social Connections: Not on file  Intimate Partner Violence: Not on file    Past Medical History, Surgical history, Social history, and Family history were reviewed and updated as appropriate.   Please see review of systems for further details on the patient's review from today.   Objective:   Physical Exam:  There were no vitals taken for this visit.  Physical Exam Constitutional:      General: He is not in acute distress. Musculoskeletal:        General: No deformity.  Neurological:     Mental Status: He is alert and oriented to person, place, and time.     Coordination: Coordination normal.  Psychiatric:        Attention and Perception: Attention and perception normal. He does not perceive auditory or visual hallucinations.        Mood and Affect: Mood normal. Mood is not anxious or depressed. Affect is not labile, blunt, angry or inappropriate.        Speech: Speech normal.        Behavior: Behavior normal.        Thought Content: Thought content normal. Thought content is not paranoid or delusional. Thought content does not include homicidal or suicidal ideation. Thought content does not include homicidal or suicidal plan.        Cognition and Memory: Cognition and memory normal.        Judgment: Judgment normal.     Comments: Insight intact     Lab Review:  No results found for: "NA", "K", "CL", "CO2", "GLUCOSE", "BUN", "CREATININE", "CALCIUM", "PROT", "ALBUMIN", "AST", "ALT", "ALKPHOS", "BILITOT", "GFRNONAA",  "GFRAA"  No results found for: "WBC", "RBC", "HGB", "HCT", "PLT", "MCV", "MCH", "MCHC", "RDW", "LYMPHSABS", "MONOABS", "EOSABS", "BASOSABS"  No results found for: "POCLITH", "LITHIUM"   No results found for: "PHENYTOIN", "PHENOBARB", "VALPROATE", "CBMZ"   .res Assessment: Plan:    Plan:  Lexapro 10mg  daily Buspar 15mg  TID  Restart Vyvanse 40mg  daily  D/C Adderall XR 20mg  BID  Started seeing a therapist - has 2 visits.   Out of work 07/19/2022 through 08/18/2021. Will discuss a RTW date at next visit.   Monitor BP between visits while taking stimulant medication.   RTC 4 weeks   Patient advised to contact office with any questions, adverse effects, or acute worsening in signs and symptoms.  Discussed potential benefits, risks, and side effects of stimulants with patient to include increased heart rate, palpitations,  insomnia, increased anxiety, increased irritability, or decreased appetite. Instructed patient to contact office if experiencing any significant tolerability issues.  There are no diagnoses linked to this encounter.   Please see After Visit Summary for patient specific instructions.  Future Appointments  Date Time Provider [place]  07/15/2022  4:40 PM Vibhav Waddill, Berdie Ogren, NP [facility] None    No orders of the defined types were placed in this encounter.     -------------------------------

## 2022-07-16 ENCOUNTER — Telehealth: Payer: Self-pay

## 2022-07-16 NOTE — Telephone Encounter (Signed)
Prior Authorization Methylphenidate HCl ER TBCR 27MG  tablets #30/30 [employer]  Approved Effective:  07/16/2022 to 07/15/2025

## 2022-07-18 ENCOUNTER — Telehealth: Payer: Self-pay | Admitting: Adult Health

## 2022-07-18 NOTE — Telephone Encounter (Signed)
Received Attending Physician Statement for extension of STD benefits. Given to [facility] 3/21

## 2022-08-02 NOTE — Telephone Encounter (Signed)
Form completed and faxed to [employer] with last office note.

## 2022-08-15 ENCOUNTER — Telehealth (INDEPENDENT_AMBULATORY_CARE_PROVIDER_SITE_OTHER): Payer: 59 | Admitting: Adult Health

## 2022-08-15 ENCOUNTER — Encounter: Payer: Self-pay | Admitting: Adult Health

## 2022-08-15 DIAGNOSIS — G4700 Insomnia, unspecified: Secondary | ICD-10-CM

## 2022-08-15 DIAGNOSIS — F331 Major depressive disorder, recurrent, moderate: Secondary | ICD-10-CM | POA: Diagnosis not present

## 2022-08-15 DIAGNOSIS — F411 Generalized anxiety disorder: Secondary | ICD-10-CM | POA: Diagnosis not present

## 2022-08-15 DIAGNOSIS — F909 Attention-deficit hyperactivity disorder, unspecified type: Secondary | ICD-10-CM | POA: Diagnosis not present

## 2022-08-15 MED ORDER — METHYLPHENIDATE HCL ER (OSM) 27 MG PO TBCR: 27.0000 mg | EXTENDED_RELEASE_TABLET | ORAL | 0 refills | Status: DC

## 2022-08-15 MED ORDER — HYDROXYZINE HCL 25 MG PO TABS: ORAL_TABLET | ORAL | 2 refills | Status: AC

## 2022-08-15 NOTE — Progress Notes (Signed)
Silvestre Mines [id number] 30-Jan-1991 32 y.o.  Virtual Visit via Video Note  I connected with pt @ on 08/15/22 at  9:00 AM EDT by a video enabled telemedicine application and verified that I am speaking with the correct person using two identifiers.   I discussed the limitations of evaluation and management by telemedicine and the availability of in person appointments. The patient expressed understanding and agreed to proceed.  I discussed the assessment and treatment plan with the patient. The patient was provided an opportunity to ask questions and all were answered. The patient agreed with the plan and demonstrated an understanding of the instructions.   The patient was advised to call back or seek an in-person evaluation if the symptoms worsen or if the condition fails to improve as anticipated.  I provided 25 minutes of non-face-to-face time during this encounter.  The patient was located at home.  The provider was located at [facility].   Dorothyann Gibbs, NP   Subjective:   Patient ID:  Devin Walker is a 32 y.o. (DOB [birth year]) male.  Chief Complaint: No chief complaint on file.   HPI Devin Walker presents for follow-up of GAD, MDD, insomnia and ADHD.  Describes mood today as "about the same". Pleasant. Denies tearfulness. Mood symptoms - reports depression, anxiety and irritability. Reports worry, rumination, and over thinking. Reports intrusive thoughts. Reports self defeating thoughts. Mood is lower. Stating "my mood has gotten worse". Reports ongoing situational stressors. Has tolerated the addition on Concerta and feel like it has been helpful. Varying interest and motivation. Taking medications a prescribed. Does not feel like he is ready to return to the work setting.  Energy levels lower - "pretty terrible". Active, exercising once or twice a week. Enjoys some usual interests and activities. Married. Lives with wife children. Mother local and supportive.   Appetite adequate. Weight stable - 140 pounds. Reports a variable sleep schedule. Staying up most of the night. When going to bed, taking hours to get to sleep. Averages 3 hours a night - laying down with son. On occasion he has used the Trazadone from a previous prescription, but it makes him feel "bad" the next day. Focus and concentration has improved with the addition of Concerta. Stating "I feel like it's working - I'm more productive". Completing tasks. Managing minimal aspects of household. Works full time - out of work currently. Denies SI or HI.  Positive for AH or VH - wife and daughter also experiencing paranormal activity in home.  Denies self harm. Denies substance use - using a reduced amount of Kratom - struggling to discontinue use. Drinking one energy drink.  Seeing a therapist - 3 to 4 visits.  Working with therapist.   Review of Systems:  Review of Systems  Musculoskeletal:  Negative for gait problem.  Neurological:  Negative for tremors.  Psychiatric/Behavioral:         Please refer to HPI    Medications: I have reviewed the patient's current medications.  Current Outpatient Medications  Medication Sig Dispense Refill   hydrOXYzine (ATARAX) 25 MG tablet Take one tablet at bedtime. 30 tablet 2   busPIRone (BUSPAR) 15 MG tablet Take 1 tablet (15 mg total) by mouth 3 (three) times daily. 90 tablet 2   escitalopram (LEXAPRO) 10 MG tablet Take 1 tablet (10 mg total) by mouth daily. 30 tablet 5   methylphenidate (CONCERTA) 27 MG PO CR tablet Take 1 tablet (27 mg total) by mouth every morning. 30 tablet 0  No current facility-administered medications for this visit.    Medication Side Effects: None  Allergies: No Known Allergies  No past medical history on file.  Family History  Problem Relation Age of Onset   Anxiety disorder Mother    Depression Mother    Anxiety disorder Father     Social History   Socioeconomic History   Marital status: Married     Spouse name: Not on file   Number of children: Not on file   Years of education: Not on file   Highest education level: Not on file  Occupational History   Not on file  Tobacco Use   Smoking status: Former    Years: 8    Types: Cigarettes    Quit date: 2016    Years since quitting: 8.3   Smokeless tobacco: Never  Vaping Use   Vaping Use: Former  Substance and Sexual Activity   Alcohol use: Not Currently   Drug use: Not on file   Sexual activity: Yes    Partners: Female  Other Topics Concern   Not on file  Social History Narrative   Not on file   Social Determinants of Health   Financial Resource Strain: Not on file  Food Insecurity: Not on file  Transportation Needs: Not on file  Physical Activity: Not on file  Stress: Not on file  Social Connections: Not on file  Intimate Partner Violence: Not on file    Past Medical History, Surgical history, Social history, and Family history were reviewed and updated as appropriate.   Please see review of systems for further details on the patient's review from today.   Objective:   Physical Exam:  There were no vitals taken for this visit.  Physical Exam Constitutional:      General: He is not in acute distress. Musculoskeletal:        General: No deformity.  Neurological:     Mental Status: He is alert and oriented to person, place, and time.     Coordination: Coordination normal.  Psychiatric:        Attention and Perception: Attention and perception normal. He does not perceive auditory or visual hallucinations.        Mood and Affect: Mood normal. Mood is not anxious or depressed. Affect is not labile, blunt, angry or inappropriate.        Speech: Speech normal.        Behavior: Behavior normal.        Thought Content: Thought content normal. Thought content is not paranoid or delusional. Thought content does not include homicidal or suicidal ideation. Thought content does not include homicidal or suicidal plan.         Cognition and Memory: Cognition and memory normal.        Judgment: Judgment normal.     Comments: Insight intact     Lab Review:  No results found for: "NA", "K", "CL", "CO2", "GLUCOSE", "BUN", "CREATININE", "CALCIUM", "PROT", "ALBUMIN", "AST", "ALT", "ALKPHOS", "BILITOT", "GFRNONAA", "GFRAA"  No results found for: "WBC", "RBC", "HGB", "HCT", "PLT", "MCV", "MCH", "MCHC", "RDW", "LYMPHSABS", "MONOABS", "EOSABS", "BASOSABS"  No results found for: "POCLITH", "LITHIUM"   No results found for: "PHENYTOIN", "PHENOBARB", "VALPROATE", "CBMZ"   .res Assessment: Plan:    Plan:  Lexapro  daily Buspar  TID Concerta  daily  Add Hydroxyzine  at bedtime.   Started seeing a therapist - has 2 visits.   Out of work 08/19/2022 through 09/17/2021. Will discuss a RTW date at  next visit.   Discussed with patient - working on a routine sleep schedule to help promote restfulness and establish a regular routine with planning to return to work next month.   Monitor BP between visits while taking stimulant medication.   RTC 4 weeks   Patient advised to contact office with any questions, adverse effects, or acute worsening in signs and symptoms.  Discussed potential benefits, risks, and side effects of stimulants with patient to include increased heart rate, palpitations, insomnia, increased anxiety, increased irritability, or decreased appetite. Instructed patient to contact office if experiencing any significant tolerability issues.  Diagnoses and all orders for this visit:  Major depressive disorder, recurrent episode, moderate  Attention deficit hyperactivity disorder (ADHD), unspecified ADHD type -     methylphenidate (CONCERTA) 27 MG PO CR tablet; Take 1 tablet (27 mg total) by mouth every morning.  Insomnia, unspecified type -     hydrOXYzine (ATARAX) 25 MG tablet; Take one tablet at bedtime.  Generalized anxiety disorder     Please see After Visit Summary for  patient specific instructions.  No future appointments.   No orders of the defined types were placed in this encounter.     -------------------------------

## 2022-08-23 ENCOUNTER — Telehealth: Payer: Self-pay | Admitting: Adult Health

## 2022-08-23 NOTE — Telephone Encounter (Signed)
Received fax from [facility] [employer] Re: Devin Walker. Placed in Traci's box to complete.

## 2022-08-26 NOTE — Telephone Encounter (Signed)
Paper due date 5/04

## 2022-08-28 DIAGNOSIS — Z0289 Encounter for other administrative examinations: Secondary | ICD-10-CM

## 2022-08-28 NOTE — Telephone Encounter (Signed)
Paper work completed. Will get Almira Coaster to review and sign.

## 2022-08-28 NOTE — Telephone Encounter (Signed)
Paper work faxed to [employer].

## 2022-08-30 ENCOUNTER — Telehealth: Payer: Self-pay | Admitting: Adult Health

## 2022-08-30 NOTE — Telephone Encounter (Signed)
Received fax from [facility] for completion of Attending Physician's Statement. Placed in Traci's box to complete.

## 2022-09-24 ENCOUNTER — Telehealth (INDEPENDENT_AMBULATORY_CARE_PROVIDER_SITE_OTHER): Payer: 59 | Admitting: Adult Health

## 2022-09-24 ENCOUNTER — Encounter: Payer: Self-pay | Admitting: Adult Health

## 2022-09-24 DIAGNOSIS — F909 Attention-deficit hyperactivity disorder, unspecified type: Secondary | ICD-10-CM

## 2022-09-24 DIAGNOSIS — F411 Generalized anxiety disorder: Secondary | ICD-10-CM | POA: Diagnosis not present

## 2022-09-24 DIAGNOSIS — F331 Major depressive disorder, recurrent, moderate: Secondary | ICD-10-CM | POA: Diagnosis not present

## 2022-09-24 DIAGNOSIS — G4700 Insomnia, unspecified: Secondary | ICD-10-CM | POA: Diagnosis not present

## 2022-09-24 NOTE — Progress Notes (Signed)
Devin Walker [id number] [birth year] 32 y.o.  Virtual Visit via Video Note  I connected with pt @ on 09/24/22 at  1:20 PM EDT by a video enabled telemedicine application and verified that I am speaking with the correct person using two identifiers.   I discussed the limitations of evaluation and management by telemedicine and the availability of in person appointments. The patient expressed understanding and agreed to proceed.  I discussed the assessment and treatment plan with the patient. The patient was provided an opportunity to ask questions and all were answered. The patient agreed with the plan and demonstrated an understanding of the instructions.   The patient was advised to call back or seek an in-person evaluation if the symptoms worsen or if the condition fails to improve as anticipated.  I provided 25 minutes of non-face-to-face time during this encounter.  The patient was located at home.  The provider was located at [facility].   Dorothyann Gibbs, NP   Subjective:   Patient ID:  Devin Walker is a 32 y.o. (DOB 1990-05-10) male.  Chief Complaint: No chief complaint on file.   HPI Amir Vonbergen presents for follow-up of GAD, MDD, insomnia and ADHD.  Describes mood today as "improving". Pleasant. Denies tearfulness. Mood symptoms - reports depression, anxiety and irritability. Reports panic attacks - "once a day to every other day, things get to be too much". Reports worry, rumination, and over thinking. Reports intrusive thoughts. Reports self defeating thoughts. Reports ongoing situational stressors.Mood is lower - "starting to have more good days". Stating "I'm getting more functional". Has tolerated the addition on Concerta and would like to increase the dose from 27mg  to 36mg  daily. Improved interest and motivation. Taking medications a prescribed. Does not feel like he is ready to return to the work setting - "I feel like I need more time".  Energy levels "ok".  Active, exercising once week. Enjoys some usual interests and activities. Married. Lives with wife children. Mother local and supportive.  Appetite adequate. Weight stable - 140 pounds. Reports sleep has improved. Averages 6 hours - has been working on a sleep schedule. Feels like the Hydroxyzine helps with sleep, but does not take every night. Reports disturbing dreams. Focus and concentration has improved - taking Concerta daily. Current dose lasting 6 to 8 hours. Completing tasks. Managing minimal aspects of household. Works full time - out of work currently.  Denies SI or HI.  Denies AH or VH. Denies self harm. Denies substance use - using a reduced amount of Kratom - twice daily. Has stopped drinking energy drinks.  Seeing a therapist - reports 3 to 4 visits. Has signed up for therapy at [facility].   Review of Systems:  Review of Systems  Musculoskeletal:  Negative for gait problem.  Neurological:  Negative for tremors.  Psychiatric/Behavioral:         Please refer to HPI    Medications: I have reviewed the patient's current medications.  Current Outpatient Medications  Medication Sig Dispense Refill   busPIRone (BUSPAR) 15 MG tablet Take 1 tablet (15 mg total) by mouth 3 (three) times daily. 90 tablet 2   escitalopram (LEXAPRO) 10 MG tablet Take 1 tablet (10 mg total) by mouth daily. 30 tablet 5   hydrOXYzine (ATARAX) 25 MG tablet Take one tablet at bedtime. 30 tablet 2   methylphenidate (CONCERTA) 27 MG PO CR tablet Take 1 tablet (27 mg total) by mouth every morning. 30 tablet 0   No current facility-administered  medications for this visit.    Medication Side Effects: None  Allergies: No Known Allergies  No past medical history on file.  Family History  Problem Relation Age of Onset   Anxiety disorder Mother    Depression Mother    Anxiety disorder Father     Social History   Socioeconomic History   Marital status: Married    Spouse name: Not on file    Number of children: Not on file   Years of education: Not on file   Highest education level: Not on file  Occupational History   Not on file  Tobacco Use   Smoking status: Former    Years: 8    Types: Cigarettes    Quit date: 2016    Years since quitting: 8.4   Smokeless tobacco: Never  Vaping Use   Vaping Use: Former  Substance and Sexual Activity   Alcohol use: Not Currently   Drug use: Not on file   Sexual activity: Yes    Partners: Female  Other Topics Concern   Not on file  Social History Narrative   Not on file   Social Determinants of Health   Financial Resource Strain: Not on file  Food Insecurity: Not on file  Transportation Needs: Not on file  Physical Activity: Not on file  Stress: Not on file  Social Connections: Not on file  Intimate Partner Violence: Not on file    Past Medical History, Surgical history, Social history, and Family history were reviewed and updated as appropriate.   Please see review of systems for further details on the patient's review from today.   Objective:   Physical Exam:  There were no vitals taken for this visit.  Physical Exam Constitutional:      General: He is not in acute distress. Musculoskeletal:        General: No deformity.  Neurological:     Mental Status: He is alert and oriented to person, place, and time.     Coordination: Coordination normal.  Psychiatric:        Attention and Perception: Attention and perception normal. He does not perceive auditory or visual hallucinations.        Mood and Affect: Mood normal. Mood is not anxious or depressed. Affect is not labile, blunt, angry or inappropriate.        Speech: Speech normal.        Behavior: Behavior normal.        Thought Content: Thought content normal. Thought content is not paranoid or delusional. Thought content does not include homicidal or suicidal ideation. Thought content does not include homicidal or suicidal plan.        Cognition and Memory:  Cognition and memory normal.        Judgment: Judgment normal.     Comments: Insight intact     Lab Review:  No results found for: "NA", "K", "CL", "CO2", "GLUCOSE", "BUN", "CREATININE", "CALCIUM", "PROT", "ALBUMIN", "AST", "ALT", "ALKPHOS", "BILITOT", "GFRNONAA", "GFRAA"  No results found for: "WBC", "RBC", "HGB", "HCT", "PLT", "MCV", "MCH", "MCHC", "RDW", "LYMPHSABS", "MONOABS", "EOSABS", "BASOSABS"  No results found for: "POCLITH", "LITHIUM"   No results found for: "PHENYTOIN", "PHENOBARB", "VALPROATE", "CBMZ"   .res Assessment: Plan:    Plan:  Increase Concerta 27mg  to 36mg  daily  Lexapro 10mg  daily Buspar 15mg  TID Hydroxyzine 25mg  at bedtime - not taking daily - a few times a week.  Started seeing a therapist - has 2 visits.   Out of work 09/18/2022  through 09/30/2021. Will discuss a RTW date at next visit.   Monitor BP between visits while taking stimulant medication.   RTC 4 weeks   Patient advised to contact office with any questions, adverse effects, or acute worsening in signs and symptoms.  Discussed potential benefits, risks, and side effects of stimulants with patient to include increased heart rate, palpitations, insomnia, increased anxiety, increased irritability, or decreased appetite. Instructed patient to contact office if experiencing any significant tolerability issues. There are no diagnoses linked to this encounter.   Please see After Visit Summary for patient specific instructions.  Future Appointments  Date Time Provider Department Center  09/24/2022  1:20 PM Terasa Orsini, Thereasa Solo, NP CP-CP None    No orders of the defined types were placed in this encounter.     -------------------------------

## 2022-09-27 ENCOUNTER — Telehealth: Payer: Self-pay | Admitting: Adult Health

## 2022-09-27 ENCOUNTER — Other Ambulatory Visit: Payer: Self-pay | Admitting: Adult Health

## 2022-09-27 DIAGNOSIS — F909 Attention-deficit hyperactivity disorder, unspecified type: Secondary | ICD-10-CM

## 2022-09-27 MED ORDER — METHYLPHENIDATE HCL ER (OSM) 36 MG PO TBCR: 36.0000 mg | EXTENDED_RELEASE_TABLET | ORAL | 0 refills | Status: DC

## 2022-09-27 NOTE — Telephone Encounter (Signed)
Script sent  

## 2022-09-27 NOTE — Telephone Encounter (Addendum)
Pt left message requesting Rx for Concerta 36 mg HT [place]. Pt out of meds.

## 2022-09-27 NOTE — Telephone Encounter (Signed)
Pt called back at 4:28p.  He said he needs generic Concerta 36 mg sent to [facility]. It is available there.  No upcoming appt scheduled.

## 2022-09-28 ENCOUNTER — Other Ambulatory Visit: Payer: Self-pay | Admitting: Adult Health

## 2022-09-28 DIAGNOSIS — F909 Attention-deficit hyperactivity disorder, unspecified type: Secondary | ICD-10-CM

## 2022-09-28 MED ORDER — METHYLPHENIDATE HCL ER (OSM) 36 MG PO TBCR: 36.0000 mg | EXTENDED_RELEASE_TABLET | ORAL | 0 refills | Status: DC

## 2022-09-28 NOTE — Telephone Encounter (Signed)
Script sent  

## 2022-10-03 ENCOUNTER — Encounter: Payer: Self-pay | Admitting: Adult Health

## 2022-10-03 ENCOUNTER — Ambulatory Visit (INDEPENDENT_AMBULATORY_CARE_PROVIDER_SITE_OTHER): Payer: 59 | Admitting: Adult Health

## 2022-10-03 DIAGNOSIS — F331 Major depressive disorder, recurrent, moderate: Secondary | ICD-10-CM

## 2022-10-03 DIAGNOSIS — F909 Attention-deficit hyperactivity disorder, unspecified type: Secondary | ICD-10-CM | POA: Diagnosis not present

## 2022-10-03 DIAGNOSIS — G4700 Insomnia, unspecified: Secondary | ICD-10-CM

## 2022-10-03 DIAGNOSIS — F411 Generalized anxiety disorder: Secondary | ICD-10-CM | POA: Diagnosis not present

## 2022-10-03 NOTE — Progress Notes (Signed)
Devin Walker [id number] 11/26/1990 32 y.o.  Subjective:   Patient ID:  Devin Walker is a 32 y.o. (DOB 1991/04/29) male.  Chief Complaint: No chief complaint on file.   HPI Devin Walker presents to the office today for follow-up of GAD, MDD, insomnia and ADHD.  Patient presenting today to discuss accommodations for returning to work.   Describes mood today as "improving". Pleasant. Denies tearfulness. Mood symptoms - reports depression, anxiety and irritability. Reports panic attacks. Reports decreased worry, rumination, and over thinking. Reports intrusive thoughts. Reports self defeating thoughts. Reports ongoing situational stressors. Mood is lower. Stating "I feel like I'm taking steps in the right direction". Feels liie medications are helpful. Reports exercising more - working with therapist. Has started increased dose of Concerta 36mg  daily and feels it is helpful. Planning to return to work setting once accommodations are approved.  Energy levels improving. Active, exercising more. Enjoys some usual interests and activities. Married. Lives with wife children. Mother local and supportive.  Appetite adequate. Weight stable - 140 pounds. Reports sleep has improved. Averages 6 hours - has been working on a sleep schedule.  Focus and concentration has improved - taking Concerta daily. Current dose lasting 6 to 8 hours. Completing tasks. Managing minimal aspects of household. Works full time - out of work currently.  Denies SI or HI.  Denies AH or VH. Denies self harm. Denies substance use - using a reduced amount of Kratom - twice daily. Has stopped drinking energy drinks.  Seeing a therapist - reports 3 to 4 visits. Has signed up for therapy at [facility].  Review of Systems:  Review of Systems  Musculoskeletal:  Negative for gait problem.  Neurological:  Negative for tremors.  Psychiatric/Behavioral:         Please refer to HPI    Medications: I have reviewed the patient's  current medications.  Current Outpatient Medications  Medication Sig Dispense Refill   busPIRone (BUSPAR) 15 MG tablet Take 1 tablet (15 mg total) by mouth 3 (three) times daily. 90 tablet 2   escitalopram (LEXAPRO) 10 MG tablet Take 1 tablet (10 mg total) by mouth daily. 30 tablet 5   hydrOXYzine (ATARAX) 25 MG tablet Take one tablet at bedtime. 30 tablet 2   methylphenidate (CONCERTA) 36 MG PO CR tablet Take 1 tablet (36 mg total) by mouth every morning. 30 tablet 0   No current facility-administered medications for this visit.    Medication Side Effects: None  Allergies: No Known Allergies  No past medical history on file.  Past Medical History, Surgical history, Social history, and Family history were reviewed and updated as appropriate.   Please see review of systems for further details on the patient's review from today.   Objective:   Physical Exam:  There were no vitals taken for this visit.  Physical Exam Constitutional:      General: He is not in acute distress. Musculoskeletal:        General: No deformity.  Neurological:     Mental Status: He is alert and oriented to person, place, and time.     Coordination: Coordination normal.  Psychiatric:        Attention and Perception: Attention and perception normal. He does not perceive auditory or visual hallucinations.        Mood and Affect: Mood normal. Mood is not anxious or depressed. Affect is not labile, blunt, angry or inappropriate.        Speech: Speech normal.  Behavior: Behavior normal.        Thought Content: Thought content normal. Thought content is not paranoid or delusional. Thought content does not include homicidal or suicidal ideation. Thought content does not include homicidal or suicidal plan.        Cognition and Memory: Cognition and memory normal.        Judgment: Judgment normal.     Comments: Insight intact     Lab Review:  No results found for: "NA", "K", "CL", "CO2", "GLUCOSE",  "BUN", "CREATININE", "CALCIUM", "PROT", "ALBUMIN", "AST", "ALT", "ALKPHOS", "BILITOT", "GFRNONAA", "GFRAA"  No results found for: "WBC", "RBC", "HGB", "HCT", "PLT", "MCV", "MCH", "MCHC", "RDW", "LYMPHSABS", "MONOABS", "EOSABS", "BASOSABS"  No results found for: "POCLITH", "LITHIUM"   No results found for: "PHENYTOIN", "PHENOBARB", "VALPROATE", "CBMZ"   .res Assessment: Plan:    Plan:  Concerta 36mg  daily  Lexapro 10mg  daily Buspar 15mg  TID Hydroxyzine 25mg  at bedtime - not taking daily - a few times a week.  Working with a Paramedic.  Plans to return to work as soon as accommodations are approved.  Monitor BP between visits while taking stimulant medication.   RTC 4 weeks   Patient advised to contact office with any questions, adverse effects, or acute worsening in signs and symptoms.  Discussed potential benefits, risks, and side effects of stimulants with patient to include increased heart rate, palpitations, insomnia, increased anxiety, increased irritability, or decreased appetite. Instructed patient to contact office if experiencing any significant tolerability issues.  Diagnoses and all orders for this visit:  Major depressive disorder, recurrent episode, moderate (HCC)  Attention deficit hyperactivity disorder (ADHD), unspecified ADHD type  Insomnia, unspecified type  Generalized anxiety disorder     Please see After Visit Summary for patient specific instructions.  No future appointments.  No orders of the defined types were placed in this encounter.   -------------------------------

## 2022-10-11 ENCOUNTER — Telehealth: Payer: Self-pay | Admitting: Adult Health

## 2022-10-11 NOTE — Telephone Encounter (Signed)
Received FMLA form for [employer]. Had paid $45.00. Given to [place].

## 2022-10-14 NOTE — Telephone Encounter (Signed)
Pt called and said that it is urgent that his FMLA be filled out and sent ASAP.

## 2022-10-14 NOTE — Telephone Encounter (Signed)
It was just received on Friday, June 14th.

## 2022-10-16 NOTE — Telephone Encounter (Signed)
Patient lvm at 4:17 regarding an update for [facility] paperwork. Ph: [phone]

## 2022-10-17 NOTE — Telephone Encounter (Signed)
Paper work due by 10/21/2022

## 2022-10-18 DIAGNOSIS — Z0289 Encounter for other administrative examinations: Secondary | ICD-10-CM

## 2022-10-18 NOTE — Telephone Encounter (Signed)
Paper work completed and given to [place] to sign. Will fax forms and office notes with accommodation requests from pt.

## 2022-10-30 ENCOUNTER — Encounter: Payer: Self-pay | Admitting: Adult Health

## 2022-10-30 ENCOUNTER — Telehealth: Payer: 59 | Admitting: Adult Health

## 2022-10-30 DIAGNOSIS — G4700 Insomnia, unspecified: Secondary | ICD-10-CM | POA: Diagnosis not present

## 2022-10-30 DIAGNOSIS — F909 Attention-deficit hyperactivity disorder, unspecified type: Secondary | ICD-10-CM | POA: Diagnosis not present

## 2022-10-30 DIAGNOSIS — F411 Generalized anxiety disorder: Secondary | ICD-10-CM

## 2022-10-30 DIAGNOSIS — F331 Major depressive disorder, recurrent, moderate: Secondary | ICD-10-CM | POA: Diagnosis not present

## 2022-10-30 MED ORDER — METHYLPHENIDATE HCL ER (OSM) 36 MG PO TBCR: 36.0000 mg | EXTENDED_RELEASE_TABLET | ORAL | 0 refills | Status: DC

## 2022-10-30 NOTE — Progress Notes (Signed)
Devin Walker [id number] 09/14/1990 32 y.o.  Subjective:   Patient ID:  Devin Walker is a 32 y.o. (DOB 1991/03/01) male.  Chief Complaint: No chief complaint on file.   HPI Lelton Freedman presents to the office today for follow-up of GAD, MDD, insomnia and ADHD.  Describes mood today as "improving". Pleasant. Denies tearfulness. Mood symptoms - reports depression, anxiety and irritability. Reports 4 to 5 panic attacks since last visit. Reports decreased worry, rumination, and over thinking. Reports negative thoughts. Reports intrusive thoughts. Reports self defeating thoughts. Reports ongoing situational stressors - financial. Mood is lower. Stating "I feel like I'm improving". Feels like medications are helpful.   Energy levels have been "bad". Feels "drained". Active, exercising some. Enjoys some usual interests and activities. Married. Lives with wife children. Mother local and supportive.  Appetite adequate. Weight stable - 140 pounds. Trying to work on a sleep schedule. Averages 6 hours. Focus and concentration has improved - taking Concerta daily. Completing tasks. Managing minimal aspects of household. Works full time - out of work currently. Planning to return to work setting once accommodations are approved. Denies SI or HI.  Denies AH or VH. Denies self harm. Denies substance use - using Kratom - 2 to 3 daily. Has stopped drinking energy drinks.  Seeing a therapist - reports 4 visits. Has signed up for therapy at [facility].   Review of Systems:  Review of Systems  Musculoskeletal:  Negative for gait problem.  Neurological:  Negative for tremors.  Psychiatric/Behavioral:         Please refer to HPI    Medications: I have reviewed the patient's current medications.  Current Outpatient Medications  Medication Sig Dispense Refill   busPIRone (BUSPAR) 15 MG tablet Take 1 tablet (15 mg total) by mouth 3 (three) times daily. 90 tablet 2   escitalopram (LEXAPRO) 10 MG tablet  Take 1 tablet (10 mg total) by mouth daily. 30 tablet 5   hydrOXYzine (ATARAX) 25 MG tablet Take one tablet at bedtime. 30 tablet 2   methylphenidate (CONCERTA) 36 MG PO CR tablet Take 1 tablet (36 mg total) by mouth every morning. 30 tablet 0   No current facility-administered medications for this visit.    Medication Side Effects: None  Allergies: No Known Allergies  No past medical history on file.  Past Medical History, Surgical history, Social history, and Family history were reviewed and updated as appropriate.   Please see review of systems for further details on the patient's review from today.   Objective:   Physical Exam:  There were no vitals taken for this visit.  Physical Exam Constitutional:      General: He is not in acute distress. Musculoskeletal:        General: No deformity.  Neurological:     Mental Status: He is alert and oriented to person, place, and time.     Coordination: Coordination normal.  Psychiatric:        Attention and Perception: Attention and perception normal. He does not perceive auditory or visual hallucinations.        Mood and Affect: Mood is anxious and depressed. Affect is not labile, blunt, angry or inappropriate.        Speech: Speech normal.        Behavior: Behavior normal.        Thought Content: Thought content normal. Thought content is not paranoid or delusional. Thought content does not include homicidal or suicidal ideation. Thought content does not include homicidal or  suicidal plan.        Cognition and Memory: Cognition and memory normal.        Judgment: Judgment normal.     Comments: Insight intact     Lab Review:  No results found for: "NA", "K", "CL", "CO2", "GLUCOSE", "BUN", "CREATININE", "CALCIUM", "PROT", "ALBUMIN", "AST", "ALT", "ALKPHOS", "BILITOT", "GFRNONAA", "GFRAA"  No results found for: "WBC", "RBC", "HGB", "HCT", "PLT", "MCV", "MCH", "MCHC", "RDW", "LYMPHSABS", "MONOABS", "EOSABS", "BASOSABS"  No  results found for: "POCLITH", "LITHIUM"   No results found for: "PHENYTOIN", "PHENOBARB", "VALPROATE", "CBMZ"   .res Assessment: Plan:    Plan:  Concerta 36mg  daily  Lexapro 10mg  daily Buspar 15mg  TID Hydroxyzine 25mg  at bedtime - not taking daily - a few times a week.  Working with a Paramedic.  Plans to return to work as soon as accommodations are approved.  Monitor BP between visits while taking stimulant medication.   RTC 4 weeks   Patient advised to contact office with any questions, adverse effects, or acute worsening in signs and symptoms.  Discussed potential benefits, risks, and side effects of stimulants with patient to include increased heart rate, palpitations, insomnia, increased anxiety, increased irritability, or decreased appetite. Instructed patient to contact office if experiencing any significant tolerability issues. Diagnoses and all orders for this visit:  Major depressive disorder, recurrent episode, moderate (HCC)  Attention deficit hyperactivity disorder (ADHD), unspecified ADHD type -     methylphenidate (CONCERTA) 36 MG PO CR tablet; Take 1 tablet (36 mg total) by mouth every morning.  Insomnia, unspecified type  Generalized anxiety disorder     Please see After Visit Summary for patient specific instructions.  No future appointments.   No orders of the defined types were placed in this encounter.   -------------------------------

## 2022-11-07 ENCOUNTER — Telehealth: Payer: Self-pay | Admitting: Adult Health

## 2022-11-07 NOTE — Telephone Encounter (Signed)
Chastity at [employer] @ 2:22p saying she received medical records through 6/6, but she needs any more recent visit records.  Also she is asking to confirm if he has been given a return to work date.  Next appt 7/30

## 2022-11-07 NOTE — Telephone Encounter (Signed)
Please see message related to disability.

## 2022-11-08 NOTE — Telephone Encounter (Signed)
Office note from 10/30/2022 faxed to [facility] and it is noted that pt can return to work once his accommodations are approved

## 2022-11-21 ENCOUNTER — Telehealth: Payer: Self-pay | Admitting: Adult Health

## 2022-11-21 NOTE — Telephone Encounter (Signed)
Patient lvm today at 11:53 inquiring on the status of the paperwork for his workplace accommodations See telephone encounter 11/07/22.

## 2022-11-21 NOTE — Telephone Encounter (Signed)
Please see message. °

## 2022-11-22 ENCOUNTER — Other Ambulatory Visit: Payer: Self-pay

## 2022-11-22 ENCOUNTER — Telehealth: Payer: Self-pay | Admitting: Adult Health

## 2022-11-22 DIAGNOSIS — F909 Attention-deficit hyperactivity disorder, unspecified type: Secondary | ICD-10-CM

## 2022-11-22 NOTE — Telephone Encounter (Signed)
Pended.

## 2022-11-22 NOTE — Telephone Encounter (Signed)
Canceled Rx at [facility]. [facility]

## 2022-11-22 NOTE — Telephone Encounter (Signed)
Pt lvm that the [facility] doesn't have his concerta in stock. [facility] [place] in [facility]

## 2022-11-25 MED ORDER — METHYLPHENIDATE HCL ER (OSM) 36 MG PO TBCR: 36.0000 mg | EXTENDED_RELEASE_TABLET | ORAL | 0 refills | Status: DC

## 2022-11-26 ENCOUNTER — Encounter: Payer: Self-pay | Admitting: Adult Health

## 2022-11-26 ENCOUNTER — Telehealth (INDEPENDENT_AMBULATORY_CARE_PROVIDER_SITE_OTHER): Payer: 59 | Admitting: Adult Health

## 2022-11-26 DIAGNOSIS — F411 Generalized anxiety disorder: Secondary | ICD-10-CM | POA: Diagnosis not present

## 2022-11-26 DIAGNOSIS — F331 Major depressive disorder, recurrent, moderate: Secondary | ICD-10-CM | POA: Diagnosis not present

## 2022-11-26 DIAGNOSIS — F909 Attention-deficit hyperactivity disorder, unspecified type: Secondary | ICD-10-CM | POA: Diagnosis not present

## 2022-11-26 DIAGNOSIS — G4700 Insomnia, unspecified: Secondary | ICD-10-CM

## 2022-11-26 NOTE — Progress Notes (Unsigned)
Devin Walker [id number] 08-13-1990 32 y.o.  Virtual Visit via Video Note  I connected with pt @ on 11/26/22 at  5:20 PM EDT by a video enabled telemedicine application and verified that I am speaking with the correct person using two identifiers.   I discussed the limitations of evaluation and management by telemedicine and the availability of in person appointments. The patient expressed understanding and agreed to proceed.  I discussed the assessment and treatment plan with the patient. The patient was provided an opportunity to ask questions and all were answered. The patient agreed with the plan and demonstrated an understanding of the instructions.   The patient was advised to call back or seek an in-person evaluation if the symptoms worsen or if the condition fails to improve as anticipated.  I provided 25 minutes of non-face-to-face time during this encounter.  The patient was located at home.  The provider was located at [facility].   Dorothyann Gibbs, NP   Subjective:   Patient ID:  Devin Walker is a 32 y.o. (DOB January 02, 1991) male.  Chief Complaint: No chief complaint on file.   HPI Devin Walker presents for follow-up of GAD, MDD, insomnia and ADHD.  Describes mood today as "a little bit better". Pleasant. Reports tearfulness. Mood symptoms - reports depression, anxiety and irritability - "working on it". Reports decreased panic attacks. Reports worry, rumination, and over thinking. Reports negative thinking - "still struggling with self esteem". Reports struggling with intrusive thoughts. Reports ongoing situational stressors - financial. Mood is lower. Stating "I'm taking it one day at a time". Feels like medications are helpful.   Energy levels lower, but improving- "still the worst thing". Active, exercising some. Enjoys some usual interests and activities. Married. Lives with wife children. Mother local and supportive.  Appetite adequate. Weight stable - 140  pounds. Sleeps better some nights than others. Averages 6 hours. Reports daytime napping - 45 ming to 1 hour. Focus and concentration improved with medication. Completing tasks. Managing minimal aspects of household. Works full time - out of work currently. Planning to return to work setting once requested accommodations are approved. Denies SI or HI.  Denies AH or VH. Denies self harm. Denies substance use - using Kratom - twice daily. Reaching out to NA to help with use. Has stopped drinking energy drinks.  Seeing a therapist - "off and on" at [facility].  Review of Systems:  Review of Systems  Musculoskeletal:  Negative for gait problem.  Neurological:  Negative for tremors.  Psychiatric/Behavioral:         Please refer to HPI    Medications: I have reviewed the patient's current medications.  Current Outpatient Medications  Medication Sig Dispense Refill   busPIRone (BUSPAR) 15 MG tablet Take 1 tablet (15 mg total) by mouth 3 (three) times daily. 90 tablet 2   escitalopram (LEXAPRO) 10 MG tablet Take 1 tablet (10 mg total) by mouth daily. 30 tablet 5   hydrOXYzine (ATARAX) 25 MG tablet Take one tablet at bedtime. 30 tablet 2   methylphenidate (CONCERTA) 36 MG PO CR tablet Take 1 tablet (36 mg total) by mouth every morning. 30 tablet 0   No current facility-administered medications for this visit.    Medication Side Effects: None  Allergies: No Known Allergies  No past medical history on file.  Family History  Problem Relation Age of Onset   Anxiety disorder Mother    Depression Mother    Anxiety disorder Father  Social History   Socioeconomic History   Marital status: Married    Spouse name: Not on file   Number of children: Not on file   Years of education: Not on file   Highest education level: Not on file  Occupational History   Not on file  Tobacco Use   Smoking status: Former    Current packs/day: 0.00    Types: Cigarettes    Start date: 2008     Quit date: 2016    Years since quitting: 8.5   Smokeless tobacco: Never  Vaping Use   Vaping status: Former  Substance and Sexual Activity   Alcohol use: Not Currently   Drug use: Not on file   Sexual activity: Yes    Partners: Female  Other Topics Concern   Not on file  Social History Narrative   Not on file   Social Determinants of Health   Financial Resource Strain: Not on file  Food Insecurity: Not on file  Transportation Needs: Not on file  Physical Activity: Not on file  Stress: Not on file  Social Connections: Not on file  Intimate Partner Violence: Not on file    Past Medical History, Surgical history, Social history, and Family history were reviewed and updated as appropriate.   Please see review of systems for further details on the patient's review from today.   Objective:   Physical Exam:  There were no vitals taken for this visit.  Physical Exam Constitutional:      General: He is not in acute distress. Musculoskeletal:        General: No deformity.  Neurological:     Mental Status: He is alert and oriented to person, place, and time.     Coordination: Coordination normal.  Psychiatric:        Attention and Perception: Attention and perception normal. He does not perceive auditory or visual hallucinations.        Mood and Affect: Mood normal. Affect is not labile, blunt, angry or inappropriate.        Speech: Speech normal.        Behavior: Behavior normal.        Thought Content: Thought content normal. Thought content is not paranoid or delusional. Thought content does not include homicidal or suicidal ideation. Thought content does not include homicidal or suicidal plan.        Cognition and Memory: Cognition and memory normal.        Judgment: Judgment normal.     Comments: Insight intact     Lab Review:  No results found for: "NA", "K", "CL", "CO2", "GLUCOSE", "BUN", "CREATININE", "CALCIUM", "PROT", "ALBUMIN", "AST", "ALT", "ALKPHOS",  "BILITOT", "GFRNONAA", "GFRAA"  No results found for: "WBC", "RBC", "HGB", "HCT", "PLT", "MCV", "MCH", "MCHC", "RDW", "LYMPHSABS", "MONOABS", "EOSABS", "BASOSABS"  No results found for: "POCLITH", "LITHIUM"   No results found for: "PHENYTOIN", "PHENOBARB", "VALPROATE", "CBMZ"   .res Assessment: Plan:    Plan:  Concerta 36mg  daily  Lexapro 10mg  daily Buspar 15mg  TID Hydroxyzine 25mg  at bedtime - not taking daily - a few times a week.  Working with a Paramedic.  Plans to return to work as soon as work place accommodations are approved.   Monitor BP between visits while taking stimulant medication.   RTC 4 weeks   Patient advised to contact office with any questions, adverse effects, or acute worsening in signs and symptoms.  Discussed potential benefits, risks, and side effects of stimulants with patient to include increased heart  rate, palpitations, insomnia, increased anxiety, increased irritability, or decreased appetite. Instructed patient to contact office if experiencing any significant tolerability issues.  Diagnoses and all orders for this visit:  Major depressive disorder, recurrent episode, moderate (HCC)  Attention deficit hyperactivity disorder (ADHD), unspecified ADHD type  Insomnia, unspecified type  Generalized anxiety disorder     Please see After Visit Summary for patient specific instructions.  No future appointments.  No orders of the defined types were placed in this encounter.     -------------------------------

## 2022-11-26 NOTE — Telephone Encounter (Signed)
Pt's last office note from 10/30/2022 was faxed on 11/06/2022.   Once pt's accommodations approved he will be given a date to return to work, pt is ready when things are approved from his HR

## 2022-12-16 ENCOUNTER — Telehealth: Payer: Self-pay | Admitting: Adult Health

## 2022-12-16 NOTE — Telephone Encounter (Signed)
Pt lvm and called checking to see if we received paperwork from [employer] on 11/28/22 on his work [employer]. If we haven't received them we need to call Ryann and he will have them send it again

## 2022-12-17 NOTE — Telephone Encounter (Signed)
Spoke with Devin Walker and faxed him copy of restrictions received from [place]. Pt will review and give me a call back.

## 2022-12-20 NOTE — Telephone Encounter (Signed)
Left pt a message to call back in response to the fax received from [place].  They are requesting [name] sign the back page if agrees with pt restrictions they recommended then fax back.

## 2022-12-23 NOTE — Telephone Encounter (Signed)
Accommodations recommended by Devin Walker were signed and faxed.

## 2023-01-07 ENCOUNTER — Other Ambulatory Visit: Payer: Self-pay

## 2023-01-07 ENCOUNTER — Telehealth: Payer: Self-pay | Admitting: Adult Health

## 2023-01-07 DIAGNOSIS — F909 Attention-deficit hyperactivity disorder, unspecified type: Secondary | ICD-10-CM

## 2023-01-07 MED ORDER — METHYLPHENIDATE HCL ER (OSM) 36 MG PO TBCR: 36.0000 mg | EXTENDED_RELEASE_TABLET | ORAL | 0 refills | Status: AC

## 2023-01-07 NOTE — Telephone Encounter (Signed)
Jarom made appt for 9/13.  Please send in prescription for concerta.

## 2023-01-07 NOTE — Telephone Encounter (Signed)
Requesting refill on Concerta, called to:  [id number] [place],  - [address]   Phone: [phone]  Fax: [phone]

## 2023-01-07 NOTE — Telephone Encounter (Signed)
Please call to schedule FU, was due last month.

## 2023-01-07 NOTE — Telephone Encounter (Signed)
Next appt is 01/10/23.

## 2023-01-07 NOTE — Telephone Encounter (Signed)
Pended.

## 2023-01-10 ENCOUNTER — Encounter: Payer: Self-pay | Admitting: Adult Health

## 2023-01-10 ENCOUNTER — Ambulatory Visit (INDEPENDENT_AMBULATORY_CARE_PROVIDER_SITE_OTHER): Payer: 59 | Admitting: Adult Health

## 2023-01-10 DIAGNOSIS — F909 Attention-deficit hyperactivity disorder, unspecified type: Secondary | ICD-10-CM | POA: Diagnosis not present

## 2023-01-10 DIAGNOSIS — F411 Generalized anxiety disorder: Secondary | ICD-10-CM | POA: Diagnosis not present

## 2023-01-10 DIAGNOSIS — F331 Major depressive disorder, recurrent, moderate: Secondary | ICD-10-CM | POA: Diagnosis not present

## 2023-01-10 DIAGNOSIS — G4700 Insomnia, unspecified: Secondary | ICD-10-CM

## 2023-01-10 NOTE — Progress Notes (Signed)
Rodolphe Rettig [id number] 1990-07-01 32 y.o.  Subjective:   Patient ID:  Devin Walker is a 32 y.o. (DOB May 16, 1990) male.  Chief Complaint: No chief complaint on file.   HPI Devin Walker presents to the office today for follow-up of GAD, MDD, insomnia and ADHD.  Describes mood today as "better". Pleasant. Reports tearfulness. Mood symptoms - decreased depression, anxiety and irritability. Reports decreased panic attacks - "one last week". Reports worry, rumination, and over thinking. Reports negative thinking - "not being able to provide". Reports struggling with intrusive thoughts. Reports ongoing situational stressors - financial. Mood is lower. Stating "overall, I'm feeling better - not 100%". Planning to return to work. Improved interest and motivation. Feels like medications are helpful.   Energy levels improved. Active, exercising some. Enjoys some usual interests and activities. Married. Lives with wife and 4 children. Mother local and supportive.  Appetite adequate. Weight stable - 140 pounds. Sleeps better some nights than others. Averages 7 hours.  Focus and concentration improved - "more or less". Completing tasks. Managing minimal aspects of household. Works full time - returning to work next week. Denies SI or HI.  Denies AH or VH. Denies self harm. Denies substance use - using Kratom - twice daily.   Seeing a therapist - "off and on" at [facility].  Review of Systems:  Review of Systems  Musculoskeletal:  Negative for gait problem.  Neurological:  Negative for tremors.  Psychiatric/Behavioral:         Please refer to HPI    Medications: I have reviewed the patient's current medications.  Current Outpatient Medications  Medication Sig Dispense Refill   busPIRone (BUSPAR) 15 MG tablet Take 1 tablet (15 mg total) by mouth 3 (three) times daily. 90 tablet 2   escitalopram (LEXAPRO) 10 MG tablet Take 1 tablet (10 mg total) by mouth daily. 30 tablet 5   hydrOXYzine  (ATARAX) 25 MG tablet Take one tablet at bedtime. 30 tablet 2   methylphenidate (CONCERTA) 36 MG PO CR tablet Take 1 tablet (36 mg total) by mouth every morning. 30 tablet 0   No current facility-administered medications for this visit.    Medication Side Effects: None  Allergies: No Known Allergies  No past medical history on file.  Past Medical History, Surgical history, Social history, and Family history were reviewed and updated as appropriate.   Please see review of systems for further details on the patient's review from today.   Objective:   Physical Exam:  There were no vitals taken for this visit.  Physical Exam Constitutional:      General: He is not in acute distress. Musculoskeletal:        General: No deformity.  Neurological:     Mental Status: He is alert and oriented to person, place, and time.     Coordination: Coordination normal.  Psychiatric:        Attention and Perception: Attention and perception normal. He does not perceive auditory or visual hallucinations.        Mood and Affect: Mood normal. Mood is not anxious or depressed. Affect is not labile, blunt, angry or inappropriate.        Speech: Speech normal.        Behavior: Behavior normal.        Thought Content: Thought content normal. Thought content is not paranoid or delusional. Thought content does not include homicidal or suicidal ideation. Thought content does not include homicidal or suicidal plan.  Cognition and Memory: Cognition and memory normal.        Judgment: Judgment normal.     Comments: Insight intact     Lab Review:  No results found for: "NA", "K", "CL", "CO2", "GLUCOSE", "BUN", "CREATININE", "CALCIUM", "PROT", "ALBUMIN", "AST", "ALT", "ALKPHOS", "BILITOT", "GFRNONAA", "GFRAA"  No results found for: "WBC", "RBC", "HGB", "HCT", "PLT", "MCV", "MCH", "MCHC", "RDW", "LYMPHSABS", "MONOABS", "EOSABS", "BASOSABS"  No results found for: "POCLITH", "LITHIUM"   No results  found for: "PHENYTOIN", "PHENOBARB", "VALPROATE", "CBMZ"   .res Assessment: Plan:    Plan:  Concerta 36mg  daily  Lexapro 10mg  daily Buspar 15mg  TID Hydroxyzine 25mg  at bedtime - not taking daily - a few times a week.  Working with a Paramedic.  Plans to return to work next week - accommodations approved.  Monitor BP between visits while taking stimulant medication.   RTC 4 weeks   Patient advised to contact office with any questions, adverse effects, or acute worsening in signs and symptoms.  Discussed potential benefits, risks, and side effects of stimulants with patient to include increased heart rate, palpitations, insomnia, increased anxiety, increased irritability, or decreased appetite. Instructed patient to contact office if experiencing any significant tolerability issues.  Diagnoses and all orders for this visit:  Major depressive disorder, recurrent episode, moderate (HCC)  Generalized anxiety disorder  Attention deficit hyperactivity disorder (ADHD), unspecified ADHD type  Insomnia, unspecified type     Please see After Visit Summary for patient specific instructions.  No future appointments.   No orders of the defined types were placed in this encounter.   -------------------------------

## 2023-02-06 ENCOUNTER — Other Ambulatory Visit: Payer: Self-pay | Admitting: Adult Health

## 2023-02-06 DIAGNOSIS — F411 Generalized anxiety disorder: Secondary | ICD-10-CM

## 2023-02-06 DIAGNOSIS — F331 Major depressive disorder, recurrent, moderate: Secondary | ICD-10-CM

## 2023-02-11 ENCOUNTER — Ambulatory Visit (INDEPENDENT_AMBULATORY_CARE_PROVIDER_SITE_OTHER): Payer: Self-pay | Admitting: Adult Health

## 2023-02-11 DIAGNOSIS — Z0389 Encounter for observation for other suspected diseases and conditions ruled out: Secondary | ICD-10-CM

## 2023-02-11 NOTE — Progress Notes (Signed)
Patient no show appointment. ? ?

## 2023-03-23 ENCOUNTER — Other Ambulatory Visit: Payer: Self-pay | Admitting: Adult Health

## 2023-03-23 DIAGNOSIS — F331 Major depressive disorder, recurrent, moderate: Secondary | ICD-10-CM

## 2023-03-23 DIAGNOSIS — F411 Generalized anxiety disorder: Secondary | ICD-10-CM
